# Patient Record
Sex: Female | Born: 2000 | Race: White | Hispanic: No | Marital: Single | Smoking: Never smoker
Health system: Southern US, Community
[De-identification: ages and names within clinical notes are randomized; demographics above are authoritative.]

## PROBLEM LIST (undated history)

## (undated) DIAGNOSIS — F913 Oppositional defiant disorder: Secondary | ICD-10-CM

## (undated) DIAGNOSIS — F909 Attention-deficit hyperactivity disorder, unspecified type: Secondary | ICD-10-CM

## (undated) DIAGNOSIS — F633 Trichotillomania: Secondary | ICD-10-CM

## (undated) DIAGNOSIS — F32A Depression, unspecified: Secondary | ICD-10-CM

## (undated) DIAGNOSIS — J45909 Unspecified asthma, uncomplicated: Secondary | ICD-10-CM

## (undated) DIAGNOSIS — F429 Obsessive-compulsive disorder, unspecified: Secondary | ICD-10-CM

## (undated) DIAGNOSIS — F419 Anxiety disorder, unspecified: Secondary | ICD-10-CM

## (undated) HISTORY — PX: INNER EAR SURGERY: SHX679

## (undated) HISTORY — PX: TONSILLECTOMY: SUR1361

---
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
## 2020-01-18 DIAGNOSIS — F419 Anxiety disorder, unspecified: Secondary | ICD-10-CM | POA: Diagnosis not present

## 2020-01-18 DIAGNOSIS — F902 Attention-deficit hyperactivity disorder, combined type: Secondary | ICD-10-CM | POA: Diagnosis not present

## 2020-01-18 DIAGNOSIS — F3481 Disruptive mood dysregulation disorder: Secondary | ICD-10-CM | POA: Diagnosis not present

## 2020-04-19 DIAGNOSIS — F419 Anxiety disorder, unspecified: Secondary | ICD-10-CM | POA: Diagnosis not present

## 2020-04-19 DIAGNOSIS — F3481 Disruptive mood dysregulation disorder: Secondary | ICD-10-CM | POA: Diagnosis not present

## 2020-04-19 DIAGNOSIS — F902 Attention-deficit hyperactivity disorder, combined type: Secondary | ICD-10-CM | POA: Diagnosis not present

## 2020-05-20 DIAGNOSIS — H9202 Otalgia, left ear: Secondary | ICD-10-CM | POA: Diagnosis not present

## 2020-08-07 DIAGNOSIS — N926 Irregular menstruation, unspecified: Secondary | ICD-10-CM | POA: Diagnosis not present

## 2020-08-07 DIAGNOSIS — F429 Obsessive-compulsive disorder, unspecified: Secondary | ICD-10-CM | POA: Diagnosis not present

## 2020-08-07 DIAGNOSIS — F418 Other specified anxiety disorders: Secondary | ICD-10-CM | POA: Diagnosis not present

## 2020-08-07 DIAGNOSIS — M549 Dorsalgia, unspecified: Secondary | ICD-10-CM | POA: Diagnosis not present

## 2020-08-14 DIAGNOSIS — F419 Anxiety disorder, unspecified: Secondary | ICD-10-CM | POA: Diagnosis not present

## 2020-08-14 DIAGNOSIS — F3481 Disruptive mood dysregulation disorder: Secondary | ICD-10-CM | POA: Diagnosis not present

## 2020-08-14 DIAGNOSIS — F902 Attention-deficit hyperactivity disorder, combined type: Secondary | ICD-10-CM | POA: Diagnosis not present

## 2020-08-21 DIAGNOSIS — Z0000 Encounter for general adult medical examination without abnormal findings: Secondary | ICD-10-CM | POA: Diagnosis not present

## 2020-08-21 DIAGNOSIS — Z13220 Encounter for screening for lipoid disorders: Secondary | ICD-10-CM | POA: Diagnosis not present

## 2020-09-09 DIAGNOSIS — N926 Irregular menstruation, unspecified: Secondary | ICD-10-CM | POA: Diagnosis not present

## 2020-09-09 DIAGNOSIS — N946 Dysmenorrhea, unspecified: Secondary | ICD-10-CM | POA: Diagnosis not present

## 2020-09-23 DIAGNOSIS — R110 Nausea: Secondary | ICD-10-CM | POA: Diagnosis not present

## 2020-09-23 DIAGNOSIS — R350 Frequency of micturition: Secondary | ICD-10-CM | POA: Diagnosis not present

## 2020-09-23 DIAGNOSIS — N898 Other specified noninflammatory disorders of vagina: Secondary | ICD-10-CM | POA: Diagnosis not present

## 2020-12-23 DIAGNOSIS — N926 Irregular menstruation, unspecified: Secondary | ICD-10-CM | POA: Diagnosis not present

## 2020-12-23 DIAGNOSIS — N946 Dysmenorrhea, unspecified: Secondary | ICD-10-CM | POA: Diagnosis not present

## 2020-12-23 DIAGNOSIS — F418 Other specified anxiety disorders: Secondary | ICD-10-CM | POA: Diagnosis not present

## 2021-03-10 ENCOUNTER — Encounter (HOSPITAL_BASED_OUTPATIENT_CLINIC_OR_DEPARTMENT_OTHER): Payer: Self-pay

## 2021-03-10 ENCOUNTER — Other Ambulatory Visit: Payer: Self-pay

## 2021-03-10 ENCOUNTER — Emergency Department (HOSPITAL_BASED_OUTPATIENT_CLINIC_OR_DEPARTMENT_OTHER)
Admission: EM | Admit: 2021-03-10 | Discharge: 2021-03-10 | Disposition: A | Discharge status: Home / Self Care | Payer: BC Managed Care – PPO | Attending: Emergency Medicine | Admitting: Emergency Medicine

## 2021-03-10 DIAGNOSIS — L509 Urticaria, unspecified: Secondary | ICD-10-CM | POA: Diagnosis not present

## 2021-03-10 DIAGNOSIS — T7840XA Allergy, unspecified, initial encounter: Secondary | ICD-10-CM | POA: Diagnosis not present

## 2021-03-10 DIAGNOSIS — T781XXA Other adverse food reactions, not elsewhere classified, initial encounter: Secondary | ICD-10-CM | POA: Diagnosis not present

## 2021-03-10 DIAGNOSIS — R0902 Hypoxemia: Secondary | ICD-10-CM | POA: Diagnosis not present

## 2021-03-10 DIAGNOSIS — T782XXA Anaphylactic shock, unspecified, initial encounter: Secondary | ICD-10-CM | POA: Diagnosis not present

## 2021-03-10 MED ORDER — PREDNISONE 10 MG PO TABS: 20.0000 mg | ORAL_TABLET | Freq: Two times a day (BID) | ORAL | 0 refills | Status: DC

## 2021-03-10 MED ORDER — EPINEPHRINE 0.3 MG/0.3ML IJ SOAJ: 0.3000 mg | INTRAMUSCULAR | 0 refills | Status: DC | PRN

## 2021-03-10 MED ORDER — FAMOTIDINE IN NACL 20-0.9 MG/50ML-% IV SOLN
20.0000 mg | Freq: Once | INTRAVENOUS | Status: AC
  Administered 2021-03-10: 20 mg via INTRAVENOUS
  Filled 2021-03-10: qty 50

## 2021-03-10 MED ORDER — METHYLPREDNISOLONE SODIUM SUCC 125 MG IJ SOLR
125.0000 mg | Freq: Once | INTRAMUSCULAR | Status: AC
  Administered 2021-03-10: 125 mg via INTRAVENOUS
  Filled 2021-03-10: qty 2

## 2021-03-10 NOTE — ED Notes (Signed)
Rounded on pt. Pt currently sleeping. Pt symptoms have improved 95% per mother. Pt denies difficulty with breathing or airway. No signs of distress // VSS. Will continue to monitor

## 2021-03-10 NOTE — Discharge Instructions (Signed)
Begin taking prednisone, 20 mg twice daily for the next 3 days.  Take Benadryl 25 mg every 6 hours for the next 3 days.  Use EpiPen as per instructions if you develop recurrent allergic reaction involving airway swelling, difficulty breathing.

## 2021-03-10 NOTE — ED Notes (Signed)
Rounded on pt. Pt currently denies airway being compromised at this time. Currently on monitor will continue to assess

## 2021-03-10 NOTE — ED Provider Notes (Signed)
[facility] EMERGENCY DEPT Provider Note   CSN: [id number] Arrival date & time: 03/10/21  0234     History  Chief Complaint  Patient presents with   Allergic Reaction    Gabriela Matthews is a 21 y.o. female.  Patient is a 21 year old female presenting with an allergic reaction.  Patient was eating cashews this evening when she began to have irritation to her throat, then her face began to swell.  EMS was called and patient was given EpiPen.  She also reports taking 50 mg of Benadryl prior to coming here.  The swelling seems to be improving somewhat.  She denies to me she is having any difficulty breathing or swallowing.  Patient denies history of nut allergy.  She denies having had cashews in the past.  The history is provided by the patient.  Allergic Reaction Presenting symptoms: swelling   Severity:  Moderate Context: nuts   Relieved by:  Antihistamines and epinephrine Worsened by:  Nothing     Home Medications Prior to Admission medications   Medication Sig Start Date End Date Taking? Authorizing Provider  amphetamine-dextroamphetamine (ADDERALL XR) 30 MG 24 hr capsule Take 20 mg by mouth daily.     [provider]  escitalopram (LEXAPRO) 20 MG tablet Take 20 mg by mouth every morning. 11/13/17   [provider]  ibuprofen (ADVIL,MOTRIN) 100 MG/5ML suspension Take 5 mg/kg by mouth every 6 (six) hours as needed. For pain    [provider]  Oxcarbazepine (TRILEPTAL) 300 MG tablet Take 450-1,050 mg by mouth 2 (two) times daily. 450 mg in the am and 1050 mg at bedtime 11/04/17   [provider]      Allergies    Patient has no known allergies.    Review of Systems   Review of Systems  All other systems reviewed and are negative.  Physical Exam Updated Vital Signs BP 129/84 (BP Location: Right Arm)    Pulse 76    Temp 98.3 F (36.8 C) (Oral)    Resp 18    Ht 5\' 5"  (1.651 m)    Wt 66.2 kg    LMP 03/04/2021 (Exact Date)     SpO2 100%    BMI 24.30 kg/m  Physical Exam Vitals and nursing note reviewed.  Constitutional:      General: She is not in acute distress.    Appearance: She is well-developed. She is not diaphoretic.  HENT:     Head: Normocephalic and atraumatic.     Comments: There is swelling to the soft tissues of the face and lips.  There is no obvious intraoral swelling.      Mouth/Throat:     Mouth: Mucous membranes are moist.     Pharynx: No oropharyngeal exudate or posterior oropharyngeal erythema.  Cardiovascular:     Rate and Rhythm: Normal rate and regular rhythm.     Heart sounds: No murmur heard.   No friction rub. No gallop.  Pulmonary:     Effort: Pulmonary effort is normal. No respiratory distress.     Breath sounds: Normal breath sounds. No stridor. No wheezing.  Abdominal:     General: Bowel sounds are normal. There is no distension.     Palpations: Abdomen is soft.     Tenderness: There is no abdominal tenderness.  Musculoskeletal:        General: Normal range of motion.     Cervical back: Normal range of motion and neck supple.  Skin:    General:  Skin is warm and dry.  Neurological:     General: No focal deficit present.     Mental Status: She is alert and oriented to person, place, and time.    ED Results / Procedures / Treatments   Labs (all labs ordered are listed, but only abnormal results are displayed) Labs Reviewed - No data to display  EKG None  Radiology No results found.  Procedures Procedures    Medications Ordered in ED Medications  methylPREDNISolone sodium succinate (SOLU-MEDROL) 125 mg/2 mL injection 125 mg (has no administration in time range)  famotidine (PEPCID) IVPB 20 mg premix (has no administration in time range)    ED Course/ Medical Decision Making/ A&P  Patient is a 21 year old female presenting with swelling of her face and throat after eating cashews.  She received EpiPen by EMS and took 50 mg of Benadryl at home prior to  arrival.  Patient arrives here with stable vital signs.  There is no stridor or significant airway swelling.  Patient was given Pepcid and Solu-Medrol.  She was observed for 4 hours and symptoms have gradually improved.  I feel as though patient can safely be discharged with prednisone, Benadryl, and EpiPen.  Final Clinical Impression(s) / ED Diagnoses Final diagnoses:  None    Rx / DC Orders ED Discharge Orders     None         Geoffery Lyons, MD 03/10/21 [id number]

## 2021-03-10 NOTE — ED Triage Notes (Signed)
Pt states she had cashews around 11pm last night.  (First time) Tingling of mouth and lips, lips are still swollen, hives have improved.   Benadryl & tums given by Mom @1115  Brought in by EMS Epi given in route

## 2021-03-25 DIAGNOSIS — N946 Dysmenorrhea, unspecified: Secondary | ICD-10-CM | POA: Diagnosis not present

## 2021-03-25 DIAGNOSIS — N926 Irregular menstruation, unspecified: Secondary | ICD-10-CM | POA: Diagnosis not present

## 2021-07-14 DIAGNOSIS — F411 Generalized anxiety disorder: Secondary | ICD-10-CM | POA: Diagnosis not present

## 2021-09-16 DIAGNOSIS — N946 Dysmenorrhea, unspecified: Secondary | ICD-10-CM | POA: Diagnosis not present

## 2021-09-16 DIAGNOSIS — N926 Irregular menstruation, unspecified: Secondary | ICD-10-CM | POA: Diagnosis not present

## 2022-01-29 DIAGNOSIS — F3189 Other bipolar disorder: Secondary | ICD-10-CM | POA: Diagnosis not present

## 2022-01-29 DIAGNOSIS — F411 Generalized anxiety disorder: Secondary | ICD-10-CM | POA: Diagnosis not present

## 2022-01-29 DIAGNOSIS — F603 Borderline personality disorder: Secondary | ICD-10-CM | POA: Diagnosis not present

## 2022-02-25 ENCOUNTER — Encounter (HOSPITAL_COMMUNITY): Payer: Self-pay

## 2022-02-25 ENCOUNTER — Ambulatory Visit (HOSPITAL_COMMUNITY)
Admission: EM | Admit: 2022-02-25 | Discharge: 2022-02-25 | Disposition: A | Discharge status: Home / Self Care | Payer: BC Managed Care – PPO | Attending: Physician Assistant | Admitting: Physician Assistant

## 2022-02-25 DIAGNOSIS — Z1152 Encounter for screening for COVID-19: Secondary | ICD-10-CM | POA: Diagnosis not present

## 2022-02-25 DIAGNOSIS — F1290 Cannabis use, unspecified, uncomplicated: Secondary | ICD-10-CM | POA: Insufficient documentation

## 2022-02-25 DIAGNOSIS — J069 Acute upper respiratory infection, unspecified: Secondary | ICD-10-CM | POA: Diagnosis not present

## 2022-02-25 DIAGNOSIS — R051 Acute cough: Secondary | ICD-10-CM | POA: Diagnosis not present

## 2022-02-25 HISTORY — DX: Anxiety disorder, unspecified: F41.9

## 2022-02-25 HISTORY — DX: Depression, unspecified: F32.A

## 2022-02-25 MED ORDER — PROMETHAZINE-DM 6.25-15 MG/5ML PO SYRP: 5.0000 mL | ORAL_SOLUTION | Freq: Two times a day (BID) | ORAL | 0 refills | Status: DC | PRN

## 2022-02-25 NOTE — ED Triage Notes (Signed)
Patient c/o sore throat, fatigue, nasal congestion, and a productive cough with small amount of clear/light yellow sputum x 4 days.  Patient states she has been taking Dayquil/Nyquil and the last dose was at 1000 and Advil 400 mg at 1200 today.

## 2022-02-25 NOTE — Discharge Instructions (Signed)
We will contact you if you are positive for COVID.  Make sure that you rest and drink plenty of fluid.  Use Promethazine DM for cough.  This will make you sleepy so do not drive or drink alcohol with taking it.  You can use over-the-counter medication including Mucinex, Flonase, Tylenol, ibuprofen.  If your symptoms are not improving by next week return for reevaluation.  If anything worsens and you have severe cough, shortness of breath, nausea/vomiting interfering with oral intake, weakness you need to be seen immediately.

## 2022-02-25 NOTE — ED Provider Notes (Signed)
[facility]    CSN: [id number] Arrival date & time: 02/25/22  1315      History   Chief Complaint Chief Complaint  Patient presents with   Cough    Sore throat, fatigue. Nasal congestion. Started about 4 days ago. No improvement with otc products. - Entered by patient   Sore Throat   Fatigue   Nasal Congestion    HPI Gabriela Matthews is a 22 y.o. female.   Patient presents today with a 4-day history of URI symptoms including fatigue, sore throat, congestion, cough.  She denies any fever, chest pain, shortness of breath, nausea, vomiting, diarrhea.  Denies any known sick contacts.  She has been taking DayQuil and NyQuil without improvement of symptoms.  She has not had COVID in the past.  She has a COVID-19 vaccinations including booster.  Denies any significant past medical history including smoking or allergies.  She does report a history of asthma when she was much younger but has not required albuterol inhaler in adulthood.  She is confident she is not pregnant.    Past Medical History:  Diagnosis Date   ADHD (attention deficit hyperactivity disorder)    Anxiety    Asthma    Depression    OCD (obsessive compulsive disorder)    ODD (oppositional defiant disorder)    Trichotillomania     There are no problems to display for this patient.   Past Surgical History:  Procedure Laterality Date   INNER EAR SURGERY     TONSILLECTOMY      OB History   No obstetric history on file.      Home Medications    Prior to Admission medications   Medication Sig Start Date End Date Taking? Authorizing Provider  promethazine-dextromethorphan (PROMETHAZINE-DM) 6.25-15 MG/5ML syrup Take 5 mLs by mouth 2 (two) times daily as needed for cough. 02/25/22  Yes Viktorya Arguijo K, PA-C  amphetamine-dextroamphetamine (ADDERALL XR) 30 MG 24 hr capsule Take 20 mg by mouth daily.     [provider]  EPINEPHrine 0.3 mg/0.3 mL IJ SOAJ injection Inject 0.3 mg into the muscle as  needed for anaphylaxis. 03/10/21   Veryl Speak, MD  escitalopram (LEXAPRO) 20 MG tablet Take 20 mg by mouth every morning. 11/13/17   [provider]  ibuprofen (ADVIL,MOTRIN) 100 MG/5ML suspension Take 5 mg/kg by mouth every 6 (six) hours as needed. For pain    [provider]  Oxcarbazepine (TRILEPTAL) 300 MG tablet Take 450-1,050 mg by mouth 2 (two) times daily. 450 mg in the am and 1050 mg at bedtime 11/04/17   [provider]    Family History Family History  Problem Relation Age of Onset   Thyroid disease Mother    Diabetes Mother    Hypertension Mother    Hypertension Father     Social History Social History   Tobacco Use   Smoking status: Never   Smokeless tobacco: Never  Vaping Use   Vaping Use: Never used  Substance Use Topics   Alcohol use: Yes   Drug use: Yes    Types: Marijuana     Allergies   Other   Review of Systems Review of Systems  Constitutional:  Positive for activity change and fatigue. Negative for appetite change and fever.  HENT:  Positive for congestion, sinus pressure and sore throat. Negative for sneezing.   Respiratory:  Positive for cough. Negative for shortness of breath.   Cardiovascular:  Negative for chest pain.  Gastrointestinal:  Negative for abdominal pain, diarrhea, nausea and vomiting.  Neurological:  Negative for dizziness, light-headedness and headaches.     Physical Exam Triage Vital Signs ED Triage Vitals  Enc Vitals Group     BP 02/25/22 1438 122/83     Pulse Rate 02/25/22 1438 (!) 1     Resp 02/25/22 1438 16     Temp 02/25/22 1438 98.5 F (36.9 C)     Temp Source 02/25/22 1438 Oral     SpO2 02/25/22 1438 97 %     Weight --      Height --      Head Circumference --      Peak Flow --      Pain Score 02/25/22 1441 4     Pain Loc --      Pain Edu? --      Excl. in [place]? --    No data found.  Updated Vital Signs BP 122/83 (BP Location: Right Arm)   Pulse 100   Temp 98.5 F (36.9 C)  (Oral)   Resp 16   LMP 01/25/2022   SpO2 97%   Visual Acuity Right Eye Distance:   Left Eye Distance:   Bilateral Distance:    Right Eye Near:   Left Eye Near:    Bilateral Near:     Physical Exam Vitals reviewed.  Constitutional:      General: She is awake. She is not in acute distress.    Appearance: Normal appearance. She is well-developed. She is not ill-appearing.     Comments: Very pleasant female appears stated age in no acute distress sitting comfortably in exam room  HENT:     Head: Normocephalic and atraumatic.     Right Ear: Tympanic membrane, ear canal and external ear normal. Tympanic membrane is not erythematous or bulging.     Left Ear: Tympanic membrane, ear canal and external ear normal. Tympanic membrane is not erythematous or bulging.     Nose:     Right Sinus: No maxillary sinus tenderness or frontal sinus tenderness.     Left Sinus: No maxillary sinus tenderness or frontal sinus tenderness.     Mouth/Throat:     Pharynx: Uvula midline. No oropharyngeal exudate or posterior oropharyngeal erythema.  Cardiovascular:     Rate and Rhythm: Normal rate and regular rhythm.     Heart sounds: Normal heart sounds, S1 normal and S2 normal. No murmur heard. Pulmonary:     Effort: Pulmonary effort is normal.     Breath sounds: Normal breath sounds. No wheezing, rhonchi or rales.     Comments: Clear to auscultation bilaterally Psychiatric:        Behavior: Behavior is cooperative.      UC Treatments / Results  Labs (all labs ordered are listed, but only abnormal results are displayed) Labs Reviewed  SARS CORONAVIRUS 2 (TAT 6-24 HRS)    EKG   Radiology No results found.  Procedures Procedures (including critical care time)  Medications Ordered in UC Medications - No data to display  Initial Impression / Assessment and Plan / UC Course  I have reviewed the triage vital signs and the nursing notes.  Pertinent labs & imaging results that were  available during my care of the patient were reviewed by me and considered in my medical decision making (see chart for details).     Patient is well-appearing and afebrile.  No evidence of acute infection on physical exam that would warrant initiation of antibiotics.  Flu  testing was deferred as she has been symptomatic for 4 days and outside the window effectiveness for Tamiflu.  COVID testing was obtained and is pending.  She is young and otherwise healthy and has been vaccinated so not a candidate for antiviral therapy.  Recommended conservative treatment measures including alternating Tylenol and ibuprofen, Mucinex, Flonase for symptom relief.  She was prescribed Promethazine DM for cough.  Discussed that this can be sedating and she is not to drive or drink alcohol while taking it.  Recommended that she rest and drink plenty of fluid.  Discussed that if her symptoms are not improving within a week she is to return for reevaluation.  Discussed that if anything worsen she should be seen immediately including fever, worsening cough, shortness of breath, nausea/vomiting interfere with oral intake, weakness.  Strict return precautions given.  Work excuse note with current CDC return to work guidelines based on COVID test result provided.  Final Clinical Impressions(s) / UC Diagnoses   Final diagnoses:  Upper respiratory tract infection, unspecified type  Acute cough     Discharge Instructions      We will contact you if you are positive for COVID.  Make sure that you rest and drink plenty of fluid.  Use Promethazine DM for cough.  This will make you sleepy so do not drive or drink alcohol with taking it.  You can use over-the-counter medication including Mucinex, Flonase, Tylenol, ibuprofen.  If your symptoms are not improving by next week return for reevaluation.  If anything worsens and you have severe cough, shortness of breath, nausea/vomiting interfering with oral intake, weakness you need to  be seen immediately.    ED Prescriptions     Medication Sig Dispense Auth. Provider   promethazine-dextromethorphan (PROMETHAZINE-DM) 6.25-15 MG/5ML syrup Take 5 mLs by mouth 2 (two) times daily as needed for cough. 118 mL Raekwon Winkowski K, PA-C      PDMP not reviewed this encounter.   Terrilee Croak, PA-C 02/25/22 1522

## 2022-02-26 DIAGNOSIS — F411 Generalized anxiety disorder: Secondary | ICD-10-CM | POA: Diagnosis not present

## 2022-02-26 DIAGNOSIS — F603 Borderline personality disorder: Secondary | ICD-10-CM | POA: Diagnosis not present

## 2022-02-26 DIAGNOSIS — F3189 Other bipolar disorder: Secondary | ICD-10-CM | POA: Diagnosis not present

## 2022-02-26 LAB — SARS CORONAVIRUS 2 (TAT 6-24 HRS): SARS Coronavirus 2: NEGATIVE

## 2022-03-25 DIAGNOSIS — F411 Generalized anxiety disorder: Secondary | ICD-10-CM | POA: Diagnosis not present

## 2022-03-25 DIAGNOSIS — F3189 Other bipolar disorder: Secondary | ICD-10-CM | POA: Diagnosis not present

## 2022-03-25 DIAGNOSIS — F603 Borderline personality disorder: Secondary | ICD-10-CM | POA: Diagnosis not present

## 2022-03-30 DIAGNOSIS — Z91018 Allergy to other foods: Secondary | ICD-10-CM | POA: Diagnosis not present

## 2022-03-30 DIAGNOSIS — J3089 Other allergic rhinitis: Secondary | ICD-10-CM | POA: Diagnosis not present

## 2022-04-08 DIAGNOSIS — N809 Endometriosis, unspecified: Secondary | ICD-10-CM | POA: Diagnosis not present

## 2022-04-08 DIAGNOSIS — N946 Dysmenorrhea, unspecified: Secondary | ICD-10-CM | POA: Diagnosis not present

## 2022-04-08 DIAGNOSIS — N921 Excessive and frequent menstruation with irregular cycle: Secondary | ICD-10-CM | POA: Diagnosis not present

## 2022-04-22 DIAGNOSIS — Z0000 Encounter for general adult medical examination without abnormal findings: Secondary | ICD-10-CM | POA: Diagnosis not present

## 2022-05-06 DIAGNOSIS — F3189 Other bipolar disorder: Secondary | ICD-10-CM | POA: Diagnosis not present

## 2022-05-06 DIAGNOSIS — F603 Borderline personality disorder: Secondary | ICD-10-CM | POA: Diagnosis not present

## 2022-05-06 DIAGNOSIS — F411 Generalized anxiety disorder: Secondary | ICD-10-CM | POA: Diagnosis not present

## 2022-06-17 ENCOUNTER — Other Ambulatory Visit: Payer: Self-pay | Admitting: Nurse Practitioner

## 2022-06-17 ENCOUNTER — Other Ambulatory Visit (HOSPITAL_COMMUNITY)
Admission: RE | Admit: 2022-06-17 | Discharge: 2022-06-17 | Disposition: A | Discharge status: Home / Self Care | Payer: BC Managed Care – PPO | Source: Ambulatory Visit | Attending: Nurse Practitioner | Admitting: Nurse Practitioner

## 2022-06-17 DIAGNOSIS — Z124 Encounter for screening for malignant neoplasm of cervix: Secondary | ICD-10-CM | POA: Diagnosis not present

## 2022-06-17 DIAGNOSIS — N946 Dysmenorrhea, unspecified: Secondary | ICD-10-CM | POA: Diagnosis not present

## 2022-06-17 DIAGNOSIS — N926 Irregular menstruation, unspecified: Secondary | ICD-10-CM | POA: Diagnosis not present

## 2022-06-23 LAB — CYTOLOGY - PAP: Diagnosis: NEGATIVE

## 2022-07-07 DIAGNOSIS — F3189 Other bipolar disorder: Secondary | ICD-10-CM | POA: Diagnosis not present

## 2022-07-07 DIAGNOSIS — F411 Generalized anxiety disorder: Secondary | ICD-10-CM | POA: Diagnosis not present

## 2022-07-07 DIAGNOSIS — F603 Borderline personality disorder: Secondary | ICD-10-CM | POA: Diagnosis not present

## 2022-07-28 DIAGNOSIS — N946 Dysmenorrhea, unspecified: Secondary | ICD-10-CM | POA: Diagnosis not present

## 2022-07-28 DIAGNOSIS — R42 Dizziness and giddiness: Secondary | ICD-10-CM | POA: Diagnosis not present

## 2022-07-28 DIAGNOSIS — N926 Irregular menstruation, unspecified: Secondary | ICD-10-CM | POA: Diagnosis not present

## 2022-07-28 DIAGNOSIS — D500 Iron deficiency anemia secondary to blood loss (chronic): Secondary | ICD-10-CM | POA: Diagnosis not present

## 2022-09-15 ENCOUNTER — Other Ambulatory Visit: Payer: Self-pay

## 2022-09-15 ENCOUNTER — Emergency Department (HOSPITAL_BASED_OUTPATIENT_CLINIC_OR_DEPARTMENT_OTHER)
Admission: EM | Admit: 2022-09-15 | Discharge: 2022-09-15 | Disposition: A | Discharge status: Home / Self Care | Payer: BC Managed Care – PPO | Attending: Emergency Medicine | Admitting: Emergency Medicine

## 2022-09-15 ENCOUNTER — Encounter (HOSPITAL_BASED_OUTPATIENT_CLINIC_OR_DEPARTMENT_OTHER): Payer: Self-pay

## 2022-09-15 DIAGNOSIS — T7840XA Allergy, unspecified, initial encounter: Secondary | ICD-10-CM | POA: Insufficient documentation

## 2022-09-15 MED ORDER — PREDNISONE 10 MG PO TABS: 20.0000 mg | ORAL_TABLET | Freq: Every day | ORAL | 0 refills | Status: DC

## 2022-09-15 MED ORDER — FAMOTIDINE IN NACL 20-0.9 MG/50ML-% IV SOLN
20.0000 mg | Freq: Once | INTRAVENOUS | Status: AC
  Administered 2022-09-15: 20 mg via INTRAVENOUS
  Filled 2022-09-15: qty 50

## 2022-09-15 MED ORDER — SODIUM CHLORIDE 0.9 % IV BOLUS
1000.0000 mL | Freq: Once | INTRAVENOUS | Status: AC
  Administered 2022-09-15: 1000 mL via INTRAVENOUS

## 2022-09-15 MED ORDER — EPINEPHRINE 0.3 MG/0.3ML IJ SOAJ: 0.3000 mg | INTRAMUSCULAR | 0 refills | Status: AC | PRN

## 2022-09-15 MED ORDER — METHYLPREDNISOLONE SODIUM SUCC 125 MG IJ SOLR
125.0000 mg | Freq: Once | INTRAMUSCULAR | Status: AC
  Administered 2022-09-15: 125 mg via INTRAVENOUS
  Filled 2022-09-15: qty 2

## 2022-09-15 NOTE — ED Provider Notes (Signed)
[place] EMERGENCY DEPARTMENT AT [facility] Provider Note   CSN: [id number] Arrival date & time: 09/15/22  1334     History  Chief Complaint  Patient presents with   Allergic Reaction    Gabriela Matthews is a 22 y.o. 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  Patient has she ate a cookie with cashews in it and immediately started feeling tightness in her chest.  Patient notes that she took 50 mg of Benadryl at 12:55 PM and then used her EpiPen at 1:20 PM and then came to the ER.  Patient notes this feels similar to previous episodes of allergic reactions but notes that now that she has had medication in her the tightness in her chest has subsided.  Patient denies any difficulty breathing at this time and states that she is able to swallow saliva without issue.  Patient denies any skin color changes, rashes, facial or neck swelling, chest pain, shortness of breath, wheezing.   Home Medications Prior to Admission medications   Medication Sig Start Date End Date Taking? Authorizing Provider  predniSONE (DELTASONE) 10 MG tablet Take 2 tablets (20 mg total) by mouth daily. 09/15/22  Yes Netta Corrigan, PA-C  amphetamine-dextroamphetamine (ADDERALL XR) 30 MG 24 hr capsule Take 20 mg by mouth daily.     [provider]  EPINEPHrine 0.3 mg/0.3 mL IJ SOAJ injection Inject 0.3 mg into the muscle as needed for anaphylaxis. 09/15/22   Netta Corrigan, PA-C  escitalopram (LEXAPRO) 20 MG tablet Take 20 mg by mouth every morning. 11/13/17   [provider]  ibuprofen (ADVIL,MOTRIN) 100 MG/5ML suspension Take 5 mg/kg by mouth every 6 (six) hours as needed. For pain    [provider]  Oxcarbazepine (TRILEPTAL) 300 MG tablet Take 450-1,050 mg by mouth 2 (two) times daily. 450 mg in the am and 1050 mg at bedtime 11/04/17   [provider]  promethazine-dextromethorphan (PROMETHAZINE-DM) 6.25-15  MG/5ML syrup Take 5 mLs by mouth 2 (two) times daily as needed for cough. 02/25/22   Raspet, Noberto Retort, PA-C      Allergies    Other    Review of Systems   Review of Systems  Physical Exam Updated Vital Signs BP 123/87   Pulse (!) 103   Temp 99 F (37.2 C)   Resp 18   Ht 5\' 4"  (1.626 m)   Wt 73.5 kg   LMP 09/04/2022   SpO2 98%   BMI 27.81 kg/m  Physical Exam Constitutional:      General: She is not in acute distress. HENT:     Head:     Comments: No facial swelling noted    Mouth/Throat:     Mouth: Mucous membranes are moist.     Pharynx: No posterior oropharyngeal erythema.     Comments: No edema noted No muffled voice Able to tolerate secretions Neck:     Comments: No neck swelling noted Cardiovascular:     Rate and Rhythm: Normal rate and regular rhythm.     Pulses: Normal pulses.     Heart sounds: Normal heart sounds.  Pulmonary:     Effort: Pulmonary effort is normal. No respiratory distress.     Breath sounds: Normal breath sounds.  Musculoskeletal:        General: Normal range of motion.     Cervical back: Normal range of motion. No rigidity or tenderness.  Skin:  General: Skin is warm and dry.     Findings: No rash.  Neurological:     Mental Status: She is alert.     ED Results / Procedures / Treatments   Labs (all labs ordered are listed, but only abnormal results are displayed) Labs Reviewed - No data to display  EKG None  Radiology No results found.  Procedures Procedures    Medications Ordered in ED Medications  sodium chloride 0.9 % bolus 1,000 mL (0 mLs Intravenous Stopped 09/15/22 1605)  methylPREDNISolone sodium succinate (SOLU-MEDROL) 125 mg/2 mL injection 125 mg (125 mg Intravenous Given 09/15/22 1456)  famotidine (PEPCID) IVPB 20 mg premix (0 mg Intravenous Stopped 09/15/22 1546)    ED Course/ Medical Decision Making/ A&P                                 Medical Decision Making Risk Prescription drug management.   [name] 21 y.o. presented today for allergic reaction. Working DDx that I considered at this time includes, but not limited to, anaphylaxis, anaphylactic shock, airway compromise, simple allergic reaction, SJS/TEN.  R/o DDx: anaphylaxis, anaphylactic shock, airway compromise, SJS/TEN: These are considered less likely due to history of present illness, physical exam, labs/imaging findings  Review of prior external notes: 12/26/2022 ED  Unique Tests and My Interpretation: None  Discussion with Independent Historian:  Mom  Discussion of Management of Tests: None  Risk: Medium: prescription drug management  Risk Stratification Score: None  Plan: On exam patient was in no acute distress with stable vitals.  Patient was walking around the room when I first entered and had reassuring exam in which she was able to speak in full sentences and had clear lungs bilaterally.  The rest of patient's physical exam was also reassuring.  Due to patient already having the Benadryl and epi on board Solu-Medrol and fluids were ordered along with Pepcid.  Due to patient using the EpiPen at 1:20 PM patient will be monitored until 5:20 PM.  Patient endorsing any chest pain or shortness of breath and had clear lungs and so imaging not obtained and labs not obtained at this time.  Patient stable at this time.  At 5:25 PM patient was reassessed and stated that she felt much better after the medications and wants to be discharged.  Patient will be discharged on prednisone and have her EpiPen was refilled with primary care follow-up.  Patient given work note at her request.  Patient was given return precautions. Patient stable for discharge at this time.  Patient verbalized understanding of plan.         Final Clinical Impression(s) / ED Diagnoses Final diagnoses:  Allergic reaction, initial encounter    Rx / DC Orders ED Discharge Orders          Ordered    predniSONE (DELTASONE) 10 MG tablet  Daily         09/15/22 1729    EPINEPHrine 0.3 mg/0.3 mL IJ SOAJ injection  As needed        09/15/22 1729              Netta Corrigan, PA-C 09/15/22 1733    Ernie Avena, MD 09/15/22 [phone]

## 2022-09-15 NOTE — Discharge Instructions (Addendum)
Please follow-up with your primary care provider regarding recent symptoms and ER visit.  Today you are treated with steroids and fluids along with an antiacid and your symptoms improved.  I have prescribed for you steroids to take over the next few days and refilled your epinephrine pen.  Please return to ER if symptoms change or worsen.

## 2022-09-15 NOTE — ED Notes (Signed)
Discharge instructions, follow up care, and prescriptions reviewed and explained, pt verbalized understanding and had no further questions on d/c. Pt caox4, ambulatory, NAD on d/c.  

## 2022-09-15 NOTE — ED Triage Notes (Signed)
Pt arrives with reports of allergic reaction to nuts in a cookie today ate this around 12:40 states that she only took one bite before recognizing it had nuts in it. Reports feeling dry itchy throat with upset stomach. Took benadryl at 12:50 (50 mg), Then took Epi pen at 1320. Pt speaking in full sentences able to maintain saliva no oral swelling at this time.

## 2022-09-28 DIAGNOSIS — D500 Iron deficiency anemia secondary to blood loss (chronic): Secondary | ICD-10-CM | POA: Diagnosis not present

## 2022-09-28 DIAGNOSIS — N926 Irregular menstruation, unspecified: Secondary | ICD-10-CM | POA: Diagnosis not present

## 2022-09-28 DIAGNOSIS — N946 Dysmenorrhea, unspecified: Secondary | ICD-10-CM | POA: Diagnosis not present

## 2022-09-30 DIAGNOSIS — F3189 Other bipolar disorder: Secondary | ICD-10-CM | POA: Diagnosis not present

## 2022-09-30 DIAGNOSIS — F411 Generalized anxiety disorder: Secondary | ICD-10-CM | POA: Diagnosis not present

## 2022-09-30 DIAGNOSIS — F603 Borderline personality disorder: Secondary | ICD-10-CM | POA: Diagnosis not present

## 2022-10-01 ENCOUNTER — Telehealth: Payer: Self-pay | Admitting: *Deleted

## 2022-10-01 NOTE — Telephone Encounter (Signed)
Transition Care Management Unsuccessful Follow-up Telephone Call  Date of discharge and from where:  [facility]  09/15/2022  Attempts:  1st Attempt  Reason for unsuccessful TCM follow-up call:  Left voice message

## 2022-10-02 DIAGNOSIS — Z5181 Encounter for therapeutic drug level monitoring: Secondary | ICD-10-CM | POA: Diagnosis not present

## 2022-10-15 DIAGNOSIS — N926 Irregular menstruation, unspecified: Secondary | ICD-10-CM | POA: Diagnosis not present

## 2022-10-15 DIAGNOSIS — N946 Dysmenorrhea, unspecified: Secondary | ICD-10-CM | POA: Diagnosis not present

## 2022-10-22 DIAGNOSIS — F411 Generalized anxiety disorder: Secondary | ICD-10-CM | POA: Diagnosis not present

## 2022-11-06 ENCOUNTER — Encounter (HOSPITAL_BASED_OUTPATIENT_CLINIC_OR_DEPARTMENT_OTHER): Payer: Self-pay

## 2022-11-06 ENCOUNTER — Emergency Department (HOSPITAL_BASED_OUTPATIENT_CLINIC_OR_DEPARTMENT_OTHER)
Admission: EM | Admit: 2022-11-06 | Discharge: 2022-11-06 | Disposition: A | Discharge status: Home / Self Care | Payer: BC Managed Care – PPO | Attending: Emergency Medicine | Admitting: Emergency Medicine

## 2022-11-06 ENCOUNTER — Other Ambulatory Visit: Payer: Self-pay

## 2022-11-06 ENCOUNTER — Emergency Department (HOSPITAL_BASED_OUTPATIENT_CLINIC_OR_DEPARTMENT_OTHER): Payer: BC Managed Care – PPO

## 2022-11-06 DIAGNOSIS — N83201 Unspecified ovarian cyst, right side: Secondary | ICD-10-CM | POA: Insufficient documentation

## 2022-11-06 DIAGNOSIS — J45909 Unspecified asthma, uncomplicated: Secondary | ICD-10-CM | POA: Insufficient documentation

## 2022-11-06 DIAGNOSIS — R1030 Lower abdominal pain, unspecified: Secondary | ICD-10-CM

## 2022-11-06 DIAGNOSIS — R1012 Left upper quadrant pain: Secondary | ICD-10-CM | POA: Diagnosis not present

## 2022-11-06 DIAGNOSIS — R102 Pelvic and perineal pain: Secondary | ICD-10-CM | POA: Diagnosis not present

## 2022-11-06 LAB — COMPREHENSIVE METABOLIC PANEL
ALT: 11 U/L (ref 0–44)
AST: 13 U/L — ABNORMAL LOW (ref 15–41)
Albumin: 4.8 g/dL (ref 3.5–5.0)
Alkaline Phosphatase: 44 U/L (ref 38–126)
Anion gap: 7 (ref 5–15)
BUN: 11 mg/dL (ref 6–20)
CO2: 26 mmol/L (ref 22–32)
Calcium: 9.9 mg/dL (ref 8.9–10.3)
Chloride: 105 mmol/L (ref 98–111)
Creatinine, Ser: 0.6 mg/dL (ref 0.44–1.00)
GFR, Estimated: >60 mL/min (ref >60)
Glucose, Bld: 97 mg/dL (ref 70–99)
Potassium: 4.2 mmol/L (ref 3.5–5.1)
Sodium: 138 mmol/L (ref 135–145)
Total Bilirubin: 0.3 mg/dL (ref 0.3–1.2)
Total Protein: 7.5 g/dL (ref 6.5–8.1)

## 2022-11-06 LAB — CBC WITH DIFFERENTIAL/PLATELET
Abs Immature Granulocytes: 0.02 10*3/uL (ref 0.00–0.07)
Basophils Absolute: 0 10*3/uL (ref 0.0–0.1)
Basophils Relative: 0 %
Eosinophils Absolute: 0.1 10*3/uL (ref 0.0–0.5)
Eosinophils Relative: 2 %
HCT: 39.8 % (ref 36.0–46.0)
Hemoglobin: 13.2 g/dL (ref 12.0–15.0)
Immature Granulocytes: 0 %
Lymphocytes Relative: 26 %
Lymphs Abs: 2.3 10*3/uL (ref 0.7–4.0)
MCH: 29.1 pg (ref 26.0–34.0)
MCHC: 33.2 g/dL (ref 30.0–36.0)
MCV: 87.9 fL (ref 80.0–100.0)
Monocytes Absolute: 0.6 10*3/uL (ref 0.1–1.0)
Monocytes Relative: 7 %
Neutro Abs: 5.7 10*3/uL (ref 1.7–7.7)
Neutrophils Relative %: 65 %
Platelets: 424 10*3/uL — ABNORMAL HIGH (ref 150–400)
RBC: 4.53 MIL/uL (ref 3.87–5.11)
RDW: 12.8 % (ref 11.5–15.5)
WBC: 8.9 10*3/uL (ref 4.0–10.5)
nRBC: 0 % (ref 0.0–0.2)

## 2022-11-06 LAB — URINALYSIS, W/ REFLEX TO CULTURE (INFECTION SUSPECTED)
Bilirubin Urine: NEGATIVE
Glucose, UA: NEGATIVE mg/dL
Hgb urine dipstick: NEGATIVE
Ketones, ur: NEGATIVE mg/dL
Nitrite: NEGATIVE
Protein, ur: NEGATIVE mg/dL
Specific Gravity, Urine: 1.017 (ref 1.005–1.030)
pH: 6.5 (ref 5.0–8.0)

## 2022-11-06 LAB — PREGNANCY, URINE: Preg Test, Ur: NEGATIVE

## 2022-11-06 MED ORDER — KETOROLAC TROMETHAMINE 30 MG/ML IJ SOLN
30.0000 mg | Freq: Once | INTRAMUSCULAR | Status: AC
  Administered 2022-11-06: 30 mg via INTRAVENOUS
  Filled 2022-11-06: qty 1

## 2022-11-06 MED ORDER — NAPROXEN 375 MG PO TABS: 375.0000 mg | ORAL_TABLET | Freq: Three times a day (TID) | ORAL | 0 refills | Status: DC

## 2022-11-06 NOTE — ED Provider Notes (Signed)
[place] EMERGENCY DEPARTMENT AT [facility] Provider Note   CSN: [id number] Arrival date & time: 11/06/22  1035     History {Add pertinent medical, surgical, social history, OB history to HPI:1} Chief Complaint  Patient presents with   Abdominal Pain    Gabriela Matthews is a 22 y.o. female.  HPI     A few weeks of cramping in Pelvic pain for a few weeks, stomach pain up higher went away for now but started this morning--left upper quadrant.  Stabbing pain is a 4/10 now in LLQ, stomach pain was 7/10 cramping.  Pain waxing and waning over the last several weeks in pelvis. Stomach pain is now gone but the pelvic pain is still there. Nausea, no vomiting No diarrhea or constipation No vaginal bleeding or discharge No fever No urinary symptoms. Has had pain like this before,  was cramps. Has cysts 3cm followed by obgyn. If she had pain and nausea. Last few days worse. Took 400mg  ibuprofen   Home Medications Prior to Admission medications   Medication Sig Start Date End Date Taking? Authorizing Provider  amphetamine-dextroamphetamine (ADDERALL XR) 30 MG 24 hr capsule Take 20 mg by mouth daily.     [provider]  EPINEPHrine 0.3 mg/0.3 mL IJ SOAJ injection Inject 0.3 mg into the muscle as needed for anaphylaxis. 09/15/22   Netta Corrigan, PA-C  escitalopram (LEXAPRO) 20 MG tablet Take 20 mg by mouth every morning. 11/13/17   [provider]  ibuprofen (ADVIL,MOTRIN) 100 MG/5ML suspension Take 5 mg/kg by mouth every 6 (six) hours as needed. For pain    [provider]  Oxcarbazepine (TRILEPTAL) 300 MG tablet Take 450-1,050 mg by mouth 2 (two) times daily. 450 mg in the am and 1050 mg at bedtime 11/04/17   [provider]  predniSONE (DELTASONE) 10 MG tablet Take 2 tablets (20 mg total) by mouth daily. 09/15/22   Netta Corrigan, PA-C  promethazine-dextromethorphan (PROMETHAZINE-DM) 6.25-15 MG/5ML syrup Take 5 mLs by mouth 2 (two) times daily  as needed for cough. 02/25/22   Raspet, Noberto Retort, PA-C      Allergies    Gabapentin and Other    Review of Systems   Review of Systems  Physical Exam Updated Vital Signs BP 134/89   Pulse 87   Temp 98 F (36.7 C)   Resp 16   LMP 10/13/2022   SpO2 97%  Physical Exam  ED Results / Procedures / Treatments   Labs (all labs ordered are listed, but only abnormal results are displayed) Labs Reviewed  PREGNANCY, URINE  URINALYSIS, W/ REFLEX TO CULTURE (INFECTION SUSPECTED)    EKG None  Radiology No results found.  Procedures Procedures  {Document cardiac monitor, telemetry assessment procedure when appropriate:1}  Medications Ordered in ED Medications - No data to display  ED Course/ Medical Decision Making/ A&P   {   Click here for ABCD2, HEART and other calculatorsREFRESH Note before signing :1}                              Medical Decision Making Amount and/or Complexity of Data Reviewed Labs: ordered.   ***  {Document critical care time when appropriate:1} {Document review of labs and clinical decision tools ie heart score, Chads2Vasc2 etc:1}  {Document your independent review of radiology images, and any outside records:1} {Document your discussion with family members, caretakers, and with consultants:1} {Document social determinants of health affecting pt's  care:1} {Document your decision making why or why not admission, treatments were needed:1} Final Clinical Impression(s) / ED Diagnoses Final diagnoses:  None    Rx / DC Orders ED Discharge Orders     None

## 2022-11-06 NOTE — ED Notes (Signed)
Patient verbalizes understanding of discharge instructions. Opportunity for questioning and answers were provided. Patient discharged from ED. IV removed x1

## 2022-11-06 NOTE — ED Triage Notes (Addendum)
Pt c/o abd pain/ pelvic pain, abd pain has "faded since this morning," "stabbing" pelvic pain "for the last couple days- recurrent but not constant." Endorses "a little" nausea, no vomiting. LMP 9/30  Hx ovarian cysts

## 2022-11-19 DIAGNOSIS — F331 Major depressive disorder, recurrent, moderate: Secondary | ICD-10-CM | POA: Diagnosis not present

## 2022-11-30 DIAGNOSIS — Z0000 Encounter for general adult medical examination without abnormal findings: Secondary | ICD-10-CM | POA: Diagnosis not present

## 2022-11-30 DIAGNOSIS — Z13220 Encounter for screening for lipoid disorders: Secondary | ICD-10-CM | POA: Diagnosis not present

## 2022-11-30 DIAGNOSIS — R6889 Other general symptoms and signs: Secondary | ICD-10-CM | POA: Diagnosis not present

## 2022-11-30 DIAGNOSIS — E638 Other specified nutritional deficiencies: Secondary | ICD-10-CM | POA: Diagnosis not present

## 2022-11-30 DIAGNOSIS — M542 Cervicalgia: Secondary | ICD-10-CM | POA: Diagnosis not present

## 2022-11-30 DIAGNOSIS — G8929 Other chronic pain: Secondary | ICD-10-CM | POA: Diagnosis not present

## 2022-12-14 DIAGNOSIS — Z23 Encounter for immunization: Secondary | ICD-10-CM | POA: Diagnosis not present

## 2023-02-10 ENCOUNTER — Other Ambulatory Visit: Payer: Self-pay

## 2023-02-10 ENCOUNTER — Encounter (HOSPITAL_BASED_OUTPATIENT_CLINIC_OR_DEPARTMENT_OTHER): Payer: Self-pay | Admitting: Emergency Medicine

## 2023-02-10 DIAGNOSIS — R102 Pelvic and perineal pain: Secondary | ICD-10-CM | POA: Diagnosis present

## 2023-02-10 DIAGNOSIS — N8301 Follicular cyst of right ovary: Secondary | ICD-10-CM | POA: Insufficient documentation

## 2023-02-10 NOTE — ED Triage Notes (Signed)
Pt to ED from home c/o nausea, lower back pain, and right pelvic pain since this morning.  States does have 5cm ovarian cyst and hx of endometriosis and pain feels similar. Denies urinary changes.

## 2023-02-11 ENCOUNTER — Emergency Department (HOSPITAL_BASED_OUTPATIENT_CLINIC_OR_DEPARTMENT_OTHER): Payer: Medicaid Other

## 2023-02-11 ENCOUNTER — Emergency Department (HOSPITAL_BASED_OUTPATIENT_CLINIC_OR_DEPARTMENT_OTHER)
Admission: EM | Admit: 2023-02-11 | Discharge: 2023-02-11 | Disposition: A | Discharge status: Home / Self Care | Payer: Medicaid Other | Attending: Emergency Medicine | Admitting: Emergency Medicine

## 2023-02-11 DIAGNOSIS — N83201 Unspecified ovarian cyst, right side: Secondary | ICD-10-CM

## 2023-02-11 LAB — URINALYSIS, ROUTINE W REFLEX MICROSCOPIC
Bacteria, UA: NONE SEEN
Bilirubin Urine: NEGATIVE
Glucose, UA: NEGATIVE mg/dL
Hgb urine dipstick: NEGATIVE
Ketones, ur: NEGATIVE mg/dL
Leukocytes,Ua: NEGATIVE
Nitrite: NEGATIVE
Protein, ur: NEGATIVE mg/dL
Specific Gravity, Urine: 1.015 (ref 1.005–1.030)
pH: 6.5 (ref 5.0–8.0)

## 2023-02-11 LAB — PREGNANCY, URINE: Preg Test, Ur: NEGATIVE

## 2023-02-11 MED ORDER — TRAMADOL HCL 50 MG PO TABS: 50.0000 mg | ORAL_TABLET | Freq: Four times a day (QID) | ORAL | 0 refills | Status: DC | PRN

## 2023-02-11 MED ORDER — ONDANSETRON 4 MG PO TBDP
8.0000 mg | ORAL_TABLET | Freq: Once | ORAL | Status: AC
  Administered 2023-02-11: 8 mg via ORAL
  Filled 2023-02-11: qty 2

## 2023-02-11 MED ORDER — HYDROCODONE-ACETAMINOPHEN 5-325 MG PO TABS
2.0000 | ORAL_TABLET | Freq: Once | ORAL | Status: AC
  Administered 2023-02-11: 2 via ORAL
  Filled 2023-02-11: qty 2

## 2023-02-11 NOTE — ED Notes (Signed)
Pt transported to CT via wheelchair.

## 2023-02-11 NOTE — ED Notes (Signed)
Pt ambulates independently to bathroom with steady gait.

## 2023-02-11 NOTE — ED Provider Notes (Signed)
[place] EMERGENCY DEPARTMENT AT [facility] Provider Note   CSN: [id number] Arrival date & time: 02/10/23  2000     History  Chief Complaint  Patient presents with   Nausea   Pelvic Pain    Gabriela Matthews is a 23 y.o. female.  Patient is a 23 year old female presenting with complaints of right lower quadrant abdominal pain.  She has a history of ovarian cysts and this feels similar.  She reports a 6 cm cyst that was diagnosed in September and she is in the process of scheduling surgery for this and for endometriosis.  Pain became worse this evening and presents for evaluation of this.  No fevers or chills.  No vaginal bleeding.  No bowel or bladder complaints.  She has had several ultrasounds in the past.  The history is provided by the patient.       Home Medications Prior to Admission medications   Medication Sig Start Date End Date Taking? Authorizing Provider  amphetamine-dextroamphetamine (ADDERALL XR) 30 MG 24 hr capsule Take 20 mg by mouth daily.     [provider]  EPINEPHrine 0.3 mg/0.3 mL IJ SOAJ injection Inject 0.3 mg into the muscle as needed for anaphylaxis. 09/15/22   Netta Corrigan, PA-C  escitalopram (LEXAPRO) 20 MG tablet Take 20 mg by mouth every morning. 11/13/17   [provider]  ibuprofen (ADVIL,MOTRIN) 100 MG/5ML suspension Take 5 mg/kg by mouth every 6 (six) hours as needed. For pain    [provider]  naproxen (NAPROSYN) 375 MG tablet Take 1 tablet (375 mg total) by mouth 3 (three) times daily with meals. 11/06/22   Alvira Monday, MD  Oxcarbazepine (TRILEPTAL) 300 MG tablet Take 450-1,050 mg by mouth 2 (two) times daily. 450 mg in the am and 1050 mg at bedtime 11/04/17   [provider]  predniSONE (DELTASONE) 10 MG tablet Take 2 tablets (20 mg total) by mouth daily. 09/15/22   Netta Corrigan, PA-C  promethazine-dextromethorphan (PROMETHAZINE-DM) 6.25-15 MG/5ML syrup Take 5 mLs by mouth 2 (two) times daily  as needed for cough. 02/25/22   Raspet, Noberto Retort, PA-C      Allergies    Gabapentin and Other    Review of Systems   Review of Systems  All other systems reviewed and are negative.   Physical Exam Updated Vital Signs BP (!) 145/99   Pulse 84   Temp 98.7 F (37.1 C) (Oral)   Resp 16   Ht 5\' 4"  (1.626 m)   Wt 73 kg   SpO2 100%   BMI 27.62 kg/m  Physical Exam Vitals and nursing note reviewed.  Constitutional:      General: She is not in acute distress.    Appearance: She is well-developed. She is not diaphoretic.  HENT:     Head: Normocephalic and atraumatic.  Cardiovascular:     Rate and Rhythm: Normal rate and regular rhythm.     Heart sounds: No murmur heard.    No friction rub. No gallop.  Pulmonary:     Effort: Pulmonary effort is normal. No respiratory distress.     Breath sounds: Normal breath sounds. No wheezing.  Abdominal:     General: Bowel sounds are normal. There is no distension.     Palpations: Abdomen is soft.     Tenderness: There is abdominal tenderness. There is no guarding or rebound.     Comments: There is mild, generalized abdominal pain, but most notable in the right lower quadrant.  Musculoskeletal:        General: Normal range of motion.     Cervical back: Normal range of motion and neck supple.  Skin:    General: Skin is warm and dry.  Neurological:     General: No focal deficit present.     Mental Status: She is alert and oriented to person, place, and time.     ED Results / Procedures / Treatments   Labs (all labs ordered are listed, but only abnormal results are displayed) Labs Reviewed  PREGNANCY, URINE  URINALYSIS, ROUTINE W REFLEX MICROSCOPIC    EKG None  Radiology No results found.  Procedures Procedures    Medications Ordered in ED Medications  ondansetron (ZOFRAN-ODT) disintegrating tablet 8 mg (has no administration in time range)  HYDROcodone-acetaminophen (NORCO/VICODIN) 5-325 MG per tablet 2 tablet (has no  administration in time range)    ED Course/ Medical Decision Making/ A&P  Patient is a 23 year old female presenting with right lower quadrant pain as described in the HPI.  She has a history of ovarian cyst and this feels similar.  Patient arrives with stable vital signs and is afebrile.  Physical examination reveals tenderness to the right lower quadrant, but no peritoneal signs.  Laboratory studies obtained including urinalysis and pregnancy test, both of which are unremarkable.  CT scan with renal protocol obtained showing no evidence for renal calculus, but does show a 5.6 cm right ovarian cyst.  I suspect this to be the cause of her pain.  I will advise her to take ibuprofen and prescribed tramadol which she can take if this is not helping.  Patient is to follow-up with her GYN in the next few days.  I have considered ovarian cyst, however her presentation is not consistent with this and have very low suspicion.  Final Clinical Impression(s) / ED Diagnoses Final diagnoses:  None    Rx / DC Orders ED Discharge Orders     None         Geoffery Lyons, MD 02/11/23 [phone]

## 2023-02-11 NOTE — Discharge Instructions (Signed)
Begin taking ibuprofen 600 mg every 6 hours as needed for pain.  Begin taking tramadol as prescribed as needed for pain not relieved with ibuprofen.  Follow-up with your GYN in the next week.

## 2023-03-19 ENCOUNTER — Encounter (HOSPITAL_BASED_OUTPATIENT_CLINIC_OR_DEPARTMENT_OTHER): Payer: Self-pay | Admitting: Emergency Medicine

## 2023-03-19 ENCOUNTER — Other Ambulatory Visit: Payer: Self-pay

## 2023-03-19 ENCOUNTER — Emergency Department (HOSPITAL_BASED_OUTPATIENT_CLINIC_OR_DEPARTMENT_OTHER)
Admission: EM | Admit: 2023-03-19 | Discharge: 2023-03-19 | Disposition: A | Discharge status: Home / Self Care | Attending: Emergency Medicine | Admitting: Emergency Medicine

## 2023-03-19 ENCOUNTER — Emergency Department (HOSPITAL_BASED_OUTPATIENT_CLINIC_OR_DEPARTMENT_OTHER)

## 2023-03-19 DIAGNOSIS — D75839 Thrombocytosis, unspecified: Secondary | ICD-10-CM | POA: Insufficient documentation

## 2023-03-19 DIAGNOSIS — Z79899 Other long term (current) drug therapy: Secondary | ICD-10-CM | POA: Insufficient documentation

## 2023-03-19 DIAGNOSIS — R1031 Right lower quadrant pain: Secondary | ICD-10-CM | POA: Diagnosis present

## 2023-03-19 LAB — URINALYSIS, ROUTINE W REFLEX MICROSCOPIC
Bilirubin Urine: NEGATIVE
Glucose, UA: NEGATIVE mg/dL
Hgb urine dipstick: NEGATIVE
Ketones, ur: NEGATIVE mg/dL
Leukocytes,Ua: NEGATIVE
Nitrite: NEGATIVE
Protein, ur: NEGATIVE mg/dL
Specific Gravity, Urine: 1.007 (ref 1.005–1.030)
pH: 6.5 (ref 5.0–8.0)

## 2023-03-19 LAB — COMPREHENSIVE METABOLIC PANEL
ALT: 10 U/L (ref 0–44)
AST: 13 U/L — ABNORMAL LOW (ref 15–41)
Albumin: 4.8 g/dL (ref 3.5–5.0)
Alkaline Phosphatase: 51 U/L (ref 38–126)
Anion gap: 8 (ref 5–15)
BUN: 12 mg/dL (ref 6–20)
CO2: 24 mmol/L (ref 22–32)
Calcium: 9.4 mg/dL (ref 8.9–10.3)
Chloride: 103 mmol/L (ref 98–111)
Creatinine, Ser: 0.61 mg/dL (ref 0.44–1.00)
GFR, Estimated: >60 mL/min (ref >60)
Glucose, Bld: 89 mg/dL (ref 70–99)
Potassium: 4 mmol/L (ref 3.5–5.1)
Sodium: 135 mmol/L (ref 135–145)
Total Bilirubin: 0.3 mg/dL (ref 0.0–1.2)
Total Protein: 7.2 g/dL (ref 6.5–8.1)

## 2023-03-19 LAB — CBC
HCT: 38.4 % (ref 36.0–46.0)
Hemoglobin: 12.9 g/dL (ref 12.0–15.0)
MCH: 29.7 pg (ref 26.0–34.0)
MCHC: 33.6 g/dL (ref 30.0–36.0)
MCV: 88.3 fL (ref 80.0–100.0)
Platelets: 443 10*3/uL — ABNORMAL HIGH (ref 150–400)
RBC: 4.35 MIL/uL (ref 3.87–5.11)
RDW: 12.4 % (ref 11.5–15.5)
WBC: 6.8 10*3/uL (ref 4.0–10.5)
nRBC: 0 % (ref 0.0–0.2)

## 2023-03-19 LAB — PREGNANCY, URINE: Preg Test, Ur: NEGATIVE

## 2023-03-19 LAB — LIPASE, BLOOD: Lipase: 54 U/L — ABNORMAL HIGH (ref 11–51)

## 2023-03-19 MED ORDER — ONDANSETRON 4 MG PO TBDP: 4.0000 mg | ORAL_TABLET | Freq: Three times a day (TID) | ORAL | 0 refills | Status: DC | PRN

## 2023-03-19 MED ORDER — MORPHINE SULFATE (PF) 4 MG/ML IV SOLN
4.0000 mg | Freq: Once | INTRAVENOUS | Status: DC
  Filled 2023-03-19: qty 1

## 2023-03-19 MED ORDER — ONDANSETRON 4 MG PO TBDP
8.0000 mg | ORAL_TABLET | Freq: Once | ORAL | Status: AC
  Administered 2023-03-19: 8 mg via ORAL
  Filled 2023-03-19: qty 2

## 2023-03-19 MED ORDER — KETOROLAC TROMETHAMINE 10 MG PO TABS: 10.0000 mg | ORAL_TABLET | Freq: Four times a day (QID) | ORAL | 0 refills | Status: DC | PRN

## 2023-03-19 MED ORDER — HYDROCODONE-ACETAMINOPHEN 5-325 MG PO TABS
1.0000 | ORAL_TABLET | Freq: Once | ORAL | Status: AC
  Administered 2023-03-19: 1 via ORAL
  Filled 2023-03-19: qty 1

## 2023-03-19 MED ORDER — ONDANSETRON HCL 4 MG/2ML IJ SOLN
4.0000 mg | Freq: Once | INTRAMUSCULAR | Status: DC
  Filled 2023-03-19: qty 2

## 2023-03-19 MED ORDER — TRAMADOL HCL 50 MG PO TABS: 50.0000 mg | ORAL_TABLET | Freq: Four times a day (QID) | ORAL | 0 refills | Status: DC | PRN

## 2023-03-19 NOTE — ED Triage Notes (Signed)
 Pt c/o "sharp" pain RLQ pain today with nausea, reports hx of ovarian cyst. Tramadol and 400 mg at 0750

## 2023-03-19 NOTE — ED Provider Notes (Signed)
 [place] [facility] AT [facility] Provider Note   CSN: [id number] Arrival date & time: 03/19/23  1637     History  Chief Complaint  Patient presents with   Abdominal Pain    Gabriela Matthews is a 23 y.o. female.   Abdominal Pain   23 year old female presents emergency department with complaints of right lower abdominal pain.  States that pain began this morning around 9 or 10 AM.  Reports abrupt onset sharp pain.  States that pain was severe but has since let off significantly.  States she took a tramadol which did help with her symptoms.  States that she felt a "warm sensation in my belly" when the pain improved.  She states that she feels like her cyst may have ruptured.  Patient is with history of right-sided ovarian cyst that was planned to be operated on by OB/GYN later this month.  Patient denies any fever, chills, vomiting, urinary symptoms, vaginal symptoms, change in bowel habits.  History significant for ADHD, OCD, ODD, trichotillomania, ovarian cyst  Home Medications Prior to Admission medications   Medication Sig Start Date End Date Taking? Authorizing Provider  traMADol (ULTRAM) 50 MG tablet Take 1 tablet (50 mg total) by mouth every 6 (six) hours as needed. 03/19/23  Yes Sherian Maroon A, PA  amphetamine-dextroamphetamine (ADDERALL XR) 30 MG 24 hr capsule Take 20 mg by mouth daily.     [provider]  EPINEPHrine 0.3 mg/0.3 mL IJ SOAJ injection Inject 0.3 mg into the muscle as needed for anaphylaxis. 09/15/22   Netta Corrigan, PA-C  escitalopram (LEXAPRO) 20 MG tablet Take 20 mg by mouth every morning. 11/13/17   [provider]  ibuprofen (ADVIL,MOTRIN) 100 MG/5ML suspension Take 5 mg/kg by mouth every 6 (six) hours as needed. For pain    [provider]  naproxen (NAPROSYN) 375 MG tablet Take 1 tablet (375 mg total) by mouth 3 (three) times daily with meals. 11/06/22   Alvira Monday, MD  ondansetron (ZOFRAN-ODT) 4 MG  disintegrating tablet Take 1 tablet (4 mg total) by mouth every 8 (eight) hours as needed. 03/19/23   Peter Garter, PA  Oxcarbazepine (TRILEPTAL) 300 MG tablet Take 450-1,050 mg by mouth 2 (two) times daily. 450 mg in the am and 1050 mg at bedtime 11/04/17   [provider]  predniSONE (DELTASONE) 10 MG tablet Take 2 tablets (20 mg total) by mouth daily. 09/15/22   Netta Corrigan, PA-C  promethazine-dextromethorphan (PROMETHAZINE-DM) 6.25-15 MG/5ML syrup Take 5 mLs by mouth 2 (two) times daily as needed for cough. 02/25/22   Raspet, Noberto Retort, PA-C      Allergies    Gabapentin and Other    Review of Systems   Review of Systems  Gastrointestinal:  Positive for abdominal pain.  All other systems reviewed and are negative.   Physical Exam Updated Vital Signs BP 138/88 (BP Location: Right Arm)   Pulse 88   Temp 98.7 F (37.1 C) (Oral)   Resp 16   SpO2 99%  Physical Exam Vitals and nursing note reviewed.  Constitutional:      General: She is not in acute distress.    Appearance: She is well-developed.  HENT:     Head: Normocephalic and atraumatic.  Eyes:     Conjunctiva/sclera: Conjunctivae normal.  Cardiovascular:     Rate and Rhythm: Normal rate and regular rhythm.     Heart sounds: No murmur heard. Pulmonary:     Effort: Pulmonary effort is  normal. No respiratory distress.     Breath sounds: Normal breath sounds.  Abdominal:     Palpations: Abdomen is soft.     Tenderness: There is abdominal tenderness in the right lower quadrant. There is no right CVA tenderness, left CVA tenderness or guarding.  Musculoskeletal:        General: No swelling.     Cervical back: Neck supple.  Skin:    General: Skin is warm and dry.     Capillary Refill: Capillary refill takes less than 2 seconds.  Neurological:     Mental Status: She is alert.  Psychiatric:        Mood and Affect: Mood normal.     ED Results / Procedures / Treatments   Labs (all labs ordered are listed,  but only abnormal results are displayed) Labs Reviewed  LIPASE, BLOOD - Abnormal; Notable for the following components:      Result Value   Lipase 54 (*)    All other components within normal limits  COMPREHENSIVE METABOLIC PANEL - Abnormal; Notable for the following components:   AST 13 (*)    All other components within normal limits  CBC - Abnormal; Notable for the following components:   Platelets 443 (*)    All other components within normal limits  URINALYSIS, ROUTINE W REFLEX MICROSCOPIC - Abnormal; Notable for the following components:   Color, Urine COLORLESS (*)    All other components within normal limits  PREGNANCY, URINE    EKG None  Radiology US PELVIC COMPLETE W TRANSVAGINAL AND TORSION R/O Result Date: 03/19/2023 CLINICAL DATA:  Right lower quadrant pain EXAM: TRANSABDOMINAL AND TRANSVAGINAL ULTRASOUND OF PELVIS DOPPLER ULTRASOUND OF OVARIES TECHNIQUE: Both transabdominal and transvaginal ultrasound examinations of the pelvis were performed. Transabdominal technique was performed for global imaging of the pelvis including uterus, ovaries, adnexal regions, and pelvic cul-de-sac. It was necessary to proceed with endovaginal exam following the transabdominal exam to visualize the ovaries and endometrium. Color and duplex Doppler ultrasound was utilized to evaluate blood flow to the ovaries. COMPARISON:  CT abdomen and pelvis 02/11/2023. Pelvic ultrasound 11/06/2022. FINDINGS: Uterus Measurements: 6.9 x 3.7 x 4.6 cm = volume: 62 mL. No fibroids or other mass visualized. Endometrium Thickness: 7 mm.  No focal abnormality visualized. Right ovary Measurements: 5.9 x 4.9 x 5.4 cm = volume: 80 mL. Within the right ovary there is a 5.6 x 3.7 x 5.8 cm hypoechoic structure with internal echoes and multiple septations. Overall measurements are 5.6 x 3.7 x 5.8 cm. Appearance is similar to the prior ultrasound. Left ovary Measurements: 2.3 x 2.0 x 2.6 cm = volume: 6.1 mL. Normal appearance/no  adnexal mass. Pulsed Doppler evaluation of both ovaries demonstrates normal low-resistance arterial and venous waveforms. Other findings No abnormal free fluid. IMPRESSION: 1. No evidence for ovarian torsion. 2. Stable indeterminate complex cystic lesion in the right ovary measuring up to 5.8 cm. This is minimally increased in size compared to 11/06/2022. Recommend further characterization with MRI. Electronically Signed   By: Darliss Cheney M.D.   On: 03/19/2023 22:11    Procedures Procedures    Medications Ordered in ED Medications  HYDROcodone-acetaminophen (NORCO/VICODIN) 5-325 MG per tablet 1 tablet (1 tablet Oral Given 03/19/23 1931)  ondansetron (ZOFRAN-ODT) disintegrating tablet 8 mg (8 mg Oral Given 03/19/23 1931)    ED Course/ Medical Decision Making/ A&P  Medical Decision Making Amount and/or Complexity of Data Reviewed Labs: ordered. Radiology: ordered.  Risk Prescription drug management.   This patient presents to the ED for concern of right lower abdominal pain, this involves an extensive number of treatment options, and is a complaint that carries with it a high risk of complications and morbidity.  The differential diagnosis includes ectopic pregnancy, ovarian torsion, appendicitis, tubo-ovarian abscess, diverticulitis, SBO/LBO, volvulus, other   Co morbidities that complicate the patient evaluation  See HPI   Additional history obtained:  Additional history obtained from EMR External records from outside source obtained and reviewed including hospital records   Lab Tests:  I Ordered, and personally interpreted labs.  The pertinent results include: No leukocytosis.  No evidence of anemia.  Thrombocytosis of 443.  No Electra abnormalities.  No transaminitis.  No renal dysfunction.  Lipase mildly elevated at 54.  UA without abnormality.  Urine pregnancy negative.   Imaging Studies ordered:  I ordered imaging studies including  pelvic ultrasound I independently visualized and interpreted imaging which showed no evidence of ovarian torsion.  Complex right sided ovarian cyst measuring up to 5.8 cm. I agree with the radiologist interpretation  Cardiac Monitoring: / EKG:  The patient was maintained on a cardiac monitor.  I personally viewed and interpreted the cardiac monitored which showed an underlying rhythm of: Sinus rhythm   Consultations Obtained:  N/a   Problem List / ED Course / Critical interventions / Medication management  Right lower abdominal pain I ordered medication including Zofran, Norco   Reevaluation of the patient after these medicines showed that the patient improved I have reviewed the patients home medicines and have made adjustments as needed   Social Determinants of Health:  Denies tobacco, licit drug use.   Test / Admission - Considered:  Right lower abdominal pain Vitals signs within normal range and stable throughout visit. Laboratory/imaging studies significant for: See above 23 year old female presents emergency department with complaints of right lower abdominal pain.  States that pain began this morning around 9 or 10 AM.  Reports abrupt onset sharp pain.  States that pain was severe but has since let off significantly.  States she took a tramadol which did help with her symptoms.  States that she felt a "warm sensation in my belly" when the pain improved.  She states that she feels like her cyst may have ruptured.  Patient is with history of right-sided ovarian cyst that was planned to be operated on by OB/GYN later this month.  Patient denies any fever, chills, vomiting, urinary symptoms, vaginal symptoms, change in bowel habits. On exam, patient with tenderness right lower quadrant of abdomen/right adnexal region.  Laboratory studies reassuring without evidence of acute emergent process.  Pelvic ultrasound showed evidence of stable right-sided ovarian cyst.  Patient treated  with medications and noted resolution of symptoms.  After pelvic ultrasound imaging had resulted after 6+ hour stay while emergency department, was discussing results with patient and gauging desire for additional CT imaging.  Patient reassured by negative ultrasound findings.  Reassured by history given that pain feels very similar to prior ovarian pain that she has had in the past related to cyst.  Still concern for other etiologies of the patient's right lower abdominal pain such as appendicitis despite resolution of pain, lack of leukocytosis.  Patient elected to manage symptoms at home and return if pain returns, fever, tractable nausea, vomiting for further assessment with CT scan given prolonged imaging turnaround time.  Would recommend  follow-up with OB/GYN for repeat assessment regarding ovarian cyst; does have planned procedure later this month for cyst removal.  Treatment plan discussed at length with patient and she acknowledged understanding was agreeable to said plan.  Patient overall well-appearing, afebrile in no acute distress. Worrisome signs and symptoms were discussed with the patient, and the patient acknowledged understanding to return to the ED if noticed. Patient was stable upon discharge.          Final Clinical Impression(s) / ED Diagnoses Final diagnoses:  Right lower quadrant abdominal pain    Rx / DC Orders ED Discharge Orders     None         Peter Garter, [place] 03/19/23 [place]    Arby Barrette, MD 03/21/23 1724

## 2023-03-19 NOTE — ED Notes (Signed)
 Patient transported to [place]

## 2023-03-19 NOTE — Discharge Instructions (Signed)
 As discussed, ultrasound appeared well.  Your ovary still had a cyst on it that is slightly enlarged from last time.  Will send you home medicine to use as needed for pain.  If you develop fever, return of pain, please return to the emergency department for further assessment regarding other things that can cause right lower belly pain.  Recommend follow-up with your OB/GYN for reassessment.  Please do not hesitate to return if the worrisome signs and symptoms we discussed become apparent.

## 2023-03-26 ENCOUNTER — Encounter: Payer: Self-pay | Admitting: Physical Therapy

## 2023-03-26 ENCOUNTER — Ambulatory Visit: Payer: Medicaid Other | Attending: Sports Medicine | Admitting: Physical Therapy

## 2023-03-26 ENCOUNTER — Other Ambulatory Visit: Payer: Self-pay

## 2023-03-26 DIAGNOSIS — R102 Pelvic and perineal pain: Secondary | ICD-10-CM | POA: Diagnosis present

## 2023-03-26 DIAGNOSIS — M62838 Other muscle spasm: Secondary | ICD-10-CM | POA: Insufficient documentation

## 2023-03-26 DIAGNOSIS — R279 Unspecified lack of coordination: Secondary | ICD-10-CM | POA: Diagnosis present

## 2023-03-26 NOTE — Therapy (Signed)
 OUTPATIENT PHYSICAL THERAPY FEMALE PELVIC EVALUATION   Patient Name: Gabriela Matthews MRN: [id number] DOB:Dec 20, 2000, 23 y.o., female Today's Date: 03/26/2023  END OF SESSION:  PT End of Session - 03/26/23 0923     Visit Number 1    Number of Visits 9    Authorization Type [place] [employer]    Authorization - Number of Visits 27    PT Start Time 0930    PT Stop Time 1015    PT Time Calculation (min) 45 min    Activity Tolerance Patient tolerated treatment well    Behavior During Therapy WFL for tasks assessed/performed             Past Medical History:  Diagnosis Date   ADHD (attention deficit hyperactivity disorder)    Anxiety    Asthma    Depression    OCD (obsessive compulsive disorder)    ODD (oppositional defiant disorder)    Trichotillomania    Past Surgical History:  Procedure Laterality Date   INNER EAR SURGERY     TONSILLECTOMY     There are no active problems to display for this patient.   PCP: [facility] [place]   REFERRING PROVIDER: Hurman Horn, MD   REFERRING DIAG: [phone] (ICD-10-CM) - Pelvic floor dysfunction  THERAPY DIAG:  Unspecified lack of coordination  Other muscle spasm  Pelvic pain  Rationale for Evaluation and Treatment: Rehabilitation  ONSET DATE: June of 2024   SUBJECTIVE:                                                                                                                                                                                           SUBJECTIVE STATEMENT: "Gabriela Matthews"  Patient reports to pelvic PT due to pain secondary to possible endometriosis. She is having surgery for the endometriosis on 04/09/23.  Fluid intake: seeing a nutritionist and working on water intake - she does not like water so she is working on this (2-3 cups per day), 1 diet Dr. Reino Kent per day   PAIN:  Are you having pain? Yes NPRS scale: 2/10 current, 8-10/10 at worst Pain location: Internal and Bilateral  Pain type:  sharp Pain description: intermittent   Aggravating factors: working on feet for 8 hours  Relieving factors: painkillers, heat, sitting down   PRECAUTIONS: None  RED FLAGS: None   WEIGHT BEARING RESTRICTIONS: No  FALLS:  Has patient fallen in last 6 months? No  OCCUPATION: fast food, 8 hour shifts on feet, 5 days a week    ACTIVITY LEVEL : walks over an hour daily   PLOF: Independent with basic ADLs  PATIENT GOALS: decreased  pain, be less tense in the pelvic floor   PERTINENT HISTORY:  Hx: ADHD (attention deficit hyperactivity disorder), Anxiety, Asthma, Depression, OCD (obsessive compulsive disorder, ODD (oppositional defiant disorder), Trichotillomania  Sexual abuse: No  BOWEL MOVEMENT: Pain with bowel movement: No Type of bowel movement:Type (Bristol Stool Scale) 4, Frequency 1-2x/week which is a new thing, Strain no , and Splinting no  Fully empty rectum: No Leakage: No Pads: No Fiber supplement/laxative No  URINATION: Pain with urination: No Fully empty bladder: Yes:   Stream: Strong Urgency: Yes  Frequency: within normal limits  Leakage: none Pads: No  INTERCOURSE: not currently sexually active   Ability to have vaginal penetration Yes  Pain with intercourse: Initial Penetration - during transvaginal US  DrynessNo Climax: yes  Marinoff Scale: 0/3  PREGNANCY: Vaginal deliveries 0 Currently pregnant No  PROLAPSE: None  OBJECTIVE:  Note: Objective measures were completed at Evaluation unless otherwise noted.  PATIENT SURVEYS:  PFIQ-7: 44  COGNITION: Overall cognitive status: Within functional limits for tasks assessed     SENSATION: Light touch: Appears intact  LUMBAR SPECIAL TESTS:  Single leg stance test: Positive  FUNCTIONAL TESTS:  Squat:   GAIT: Assistive device utilized: None Comments: mild trendelenburg gait pattern with ambulation   POSTURE: rounded shoulders and forward head   LUMBARAROM/PROM: within functional limits    A/PROM A/PROM  eval  Flexion   Extension   Right lateral flexion   Left lateral flexion   Right rotation   Left rotation    (Blank rows = not tested)  LOWER EXTREMITY ROM: within normal limits   Active ROM Right eval Left eval  Hip flexion    Hip extension    Hip abduction    Hip adduction    Hip internal rotation    Hip external rotation    Knee flexion    Knee extension    Ankle dorsiflexion    Ankle plantarflexion    Ankle inversion    Ankle eversion     (Blank rows = not tested)  LOWER EXTREMITY MMT: 4+/5 bilateral knees and hips grossly  MMT Right eval Left eval  Hip flexion    Hip extension    Hip abduction    Hip adduction    Hip internal rotation    Hip external rotation    Knee flexion    Knee extension    Ankle dorsiflexion    Ankle plantarflexion    Ankle inversion    Ankle eversion     (Blank rows = not tested) PALPATION:   General: increased tone present in bilateral adductors/ hip flexors   Pelvic Alignment: within normal limits   Abdominal: upper chest breathing, decreased rib excursion, abdominal bracing at rest                 External Perineal Exam: minimal dryness noted                              Internal Pelvic Floor: overactivity noted throughout, mild pain at introitus that decreased with relaxation of musculature, lack of coordination present between pelvic floor and diaphragm, increased muscle tension preventing full active range of motion  Patient confirms identification and approves PT to assess internal pelvic floor and treatment Yes No emotional/communication barriers or cognitive limitation. Patient is motivated to learn. Patient understands and agrees with treatment goals and plan. PT explains patient will be examined in standing, sitting, and lying down to see  how their muscles and joints work. When they are ready, they will be asked to remove their underwear so PT can examine their perineum. The patient is also given the  option of providing their own chaperone as one is not provided in our facility. The patient also has the right and is explained the right to defer or refuse any part of the evaluation or treatment including the internal exam. With the patient's consent, PT will use one gloved finger to gently assess the muscles of the pelvic floor, seeing how well it contracts and relaxes and if there is muscle symmetry. After, the patient will get dressed and PT and patient will discuss exam findings and plan of care. PT and patient discuss plan of care, schedule, attendance policy and HEP activities.  PELVIC MMT:   MMT eval  Vaginal Strength testing not assessed due to high pelvic floor muscle tone throughout   Internal Anal Sphincter   External Anal Sphincter   Puborectalis   Diastasis Recti   (Blank rows = not tested)        TONE: High   PROLAPSE: none  TODAY'S TREATMENT:                                                                                                                              DATE:   EVAL 03/26/23: Examination completed, findings reviewed, pt educated on POC. Pt motivated to participate in PT and agreeable to attempt recommendations.    PATIENT EDUCATION:  Education details: relative pelvic floor anatomy, connection between the pelvic floor and diaphragm, pelvic floor connection to intraabdominal pressure, endometriosis pain management and how the pelvic floor muscles can affect this  Person educated: Patient Education method: Explanation, Demonstration, Tactile cues, Verbal cues, and Handouts Education comprehension: verbalized understanding, returned demonstration, verbal cues required, tactile cues required, and needs further education  HOME EXERCISE PROGRAM: Access Code: [email or web address] URL: [email or web address] Date: 03/26/2023 Prepared by: Earna Coder  Exercises - Supine Diaphragmatic Breathing  - 1 x daily - 7 x weekly - 2 sets - 10  reps  ASSESSMENT:  CLINICAL IMPRESSION: Patient is a 23 y.o. female  who was seen today for physical therapy evaluation and treatment for pelvic pain secondary to potential endometriosis. Patient is having surgery for endo on 03/12/23, and if she is able to get into PT before surgery, we will continue prehabilitation treatment for post-op recovery. If she returns after surgery, a post-op reassessment will be performed once pt is cleared for PT. Patient reports consistent pelvic pain bilaterally for years, increasing in April of 2024. Her pain fluctuates between 2-10/10, currently 2/10 at rest. Patient demonstrates moderate lumbopelvic weakness/stiffness and pelvic floor muscle overactivity throughout superficial and deep pelvic floor muscles. Mild pain with palpation of pelvic floor muscle layer 1, but this pain did not persist and did not return following examination. Patient demonstrates stiffness and lack of coordination/range of motion in pelvic floor. Patient educated on diaphragmatic breathing techniques  for pelvic floor relaxation - she had a difficult time performing this as opposed to upper chest breathing. Overall, patient tolerated treatment well and Pt would benefit from additional PT to further address deficits.   OBJECTIVE IMPAIRMENTS: decreased coordination, decreased endurance, decreased mobility, decreased ROM, decreased strength, and pain.   ACTIVITY LIMITATIONS: standing and transfers  PARTICIPATION LIMITATIONS: occupation  PERSONAL FACTORS: Past/current experiences and 3+ comorbidities: ADHD (attention deficit hyperactivity disorder), Anxiety, Asthma, Depression, OCD (obsessive compulsive disorder, ODD (oppositional defiant disorder), Trichotillomania  are also affecting patient's functional outcome.   REHAB POTENTIAL: Good  CLINICAL DECISION MAKING: Stable/uncomplicated  EVALUATION COMPLEXITY: Low   GOALS: Goals reviewed with patient? Yes  SHORT TERM GOALS: Target date:  04/23/2023  Pt will be independent with HEP.  Baseline: Goal status: INITIAL  2.  Pt will be independent with diaphragmatic breathing and down training activities in order to improve pelvic floor relaxation. Baseline:  Goal status: INITIAL  3.  Pt will report 50% reduction of pain due to improvements in posture, strength, and muscle length in pelvic floor for improved quality of life.  Baseline:  Goal status: INITIAL  LONG TERM GOALS: Target date: 09/26/2023  Pt will be independent with advanced HEP.  Baseline:  Goal status: INITIAL  2.  Pt will report PFIQ-7 score of less than or equal to 35 points to suggest decreased functional limitations secondary to pain to improve quality of life.  Baseline:  Goal status: INITIAL  3.  Pt will report 80% reduction of pain due to improvements in posture, strength, and muscle length in pelvic floor to improve quality of life.  Baseline:  Goal status: INITIAL  4.  Pt will demonstrate normal pelvic floor muscle tone and A/ROM, able to achieve 4/5 strength with contractions and 10 sec endurance, in order to provide appropriate lumbopelvic support in functional activities.   Baseline:  Goal status: INITIAL PLAN:  PT FREQUENCY: 1x/week  PT DURATION: 12 weeks  PLANNED INTERVENTIONS: 97110-Therapeutic exercises, 97530- Therapeutic activity, 97112- Neuromuscular re-education, 97535- Self Care, 96045- Manual therapy, Taping, Dry Needling, Joint mobilization, Spinal mobilization, Scar mobilization, Cryotherapy, and Moist heat  PLAN FOR NEXT SESSION: internal treatment for manual muscle tension relief, stretching for pelvic floor downtraining   Omar Person, PT 03/26/2023, 10:32 AM

## 2023-04-02 ENCOUNTER — Encounter (HOSPITAL_BASED_OUTPATIENT_CLINIC_OR_DEPARTMENT_OTHER): Payer: Self-pay | Admitting: Emergency Medicine

## 2023-04-02 ENCOUNTER — Emergency Department (HOSPITAL_BASED_OUTPATIENT_CLINIC_OR_DEPARTMENT_OTHER)
Admission: EM | Admit: 2023-04-02 | Discharge: 2023-04-02 | Disposition: A | Discharge status: Home / Self Care | Attending: Emergency Medicine | Admitting: Emergency Medicine

## 2023-04-02 ENCOUNTER — Other Ambulatory Visit: Payer: Self-pay

## 2023-04-02 ENCOUNTER — Emergency Department (HOSPITAL_BASED_OUTPATIENT_CLINIC_OR_DEPARTMENT_OTHER)

## 2023-04-02 DIAGNOSIS — N83201 Unspecified ovarian cyst, right side: Secondary | ICD-10-CM | POA: Insufficient documentation

## 2023-04-02 DIAGNOSIS — R102 Pelvic and perineal pain: Secondary | ICD-10-CM | POA: Diagnosis present

## 2023-04-02 LAB — WET PREP, GENITAL
Clue Cells Wet Prep HPF POC: NONE SEEN
Sperm: NONE SEEN
Trich, Wet Prep: NONE SEEN
WBC, Wet Prep HPF POC: <10 (ref <10)
Yeast Wet Prep HPF POC: NONE SEEN

## 2023-04-02 LAB — CBC WITH DIFFERENTIAL/PLATELET
Abs Immature Granulocytes: 0.01 10*3/uL (ref 0.00–0.07)
Basophils Absolute: 0 10*3/uL (ref 0.0–0.1)
Basophils Relative: 0 %
Eosinophils Absolute: 0.1 10*3/uL (ref 0.0–0.5)
Eosinophils Relative: 1 %
HCT: 39.9 % (ref 36.0–46.0)
Hemoglobin: 13.1 g/dL (ref 12.0–15.0)
Immature Granulocytes: 0 %
Lymphocytes Relative: 34 %
Lymphs Abs: 2.5 10*3/uL (ref 0.7–4.0)
MCH: 29.2 pg (ref 26.0–34.0)
MCHC: 32.8 g/dL (ref 30.0–36.0)
MCV: 88.9 fL (ref 80.0–100.0)
Monocytes Absolute: 0.6 10*3/uL (ref 0.1–1.0)
Monocytes Relative: 8 %
Neutro Abs: 4.3 10*3/uL (ref 1.7–7.7)
Neutrophils Relative %: 57 %
Platelets: 450 10*3/uL — ABNORMAL HIGH (ref 150–400)
RBC: 4.49 MIL/uL (ref 3.87–5.11)
RDW: 12.5 % (ref 11.5–15.5)
WBC: 7.5 10*3/uL (ref 4.0–10.5)
nRBC: 0 % (ref 0.0–0.2)

## 2023-04-02 LAB — COMPREHENSIVE METABOLIC PANEL
ALT: 10 U/L (ref 0–44)
AST: 14 U/L — ABNORMAL LOW (ref 15–41)
Albumin: 4.8 g/dL (ref 3.5–5.0)
Alkaline Phosphatase: 60 U/L (ref 38–126)
Anion gap: 7 (ref 5–15)
BUN: 12 mg/dL (ref 6–20)
CO2: 26 mmol/L (ref 22–32)
Calcium: 9.3 mg/dL (ref 8.9–10.3)
Chloride: 104 mmol/L (ref 98–111)
Creatinine, Ser: 0.63 mg/dL (ref 0.44–1.00)
GFR, Estimated: >60 mL/min (ref >60)
Glucose, Bld: 101 mg/dL — ABNORMAL HIGH (ref 70–99)
Potassium: 3.7 mmol/L (ref 3.5–5.1)
Sodium: 137 mmol/L (ref 135–145)
Total Bilirubin: 0.2 mg/dL (ref 0.0–1.2)
Total Protein: 7.2 g/dL (ref 6.5–8.1)

## 2023-04-02 LAB — URINALYSIS, ROUTINE W REFLEX MICROSCOPIC
Bacteria, UA: NONE SEEN
Bilirubin Urine: NEGATIVE
Glucose, UA: NEGATIVE mg/dL
Ketones, ur: NEGATIVE mg/dL
Nitrite: NEGATIVE
Protein, ur: NEGATIVE mg/dL
Specific Gravity, Urine: 1.014 (ref 1.005–1.030)
pH: 6.5 (ref 5.0–8.0)

## 2023-04-02 LAB — PREGNANCY, URINE: Preg Test, Ur: NEGATIVE

## 2023-04-02 MED ORDER — IBUPROFEN 600 MG PO TABS: 600.0000 mg | ORAL_TABLET | Freq: Four times a day (QID) | ORAL | 0 refills | Status: AC | PRN

## 2023-04-02 MED ORDER — HYDROCODONE-ACETAMINOPHEN 5-325 MG PO TABS: 1.0000 | ORAL_TABLET | Freq: Four times a day (QID) | ORAL | 0 refills | Status: DC | PRN

## 2023-04-02 MED ORDER — IBUPROFEN 400 MG PO TABS
600.0000 mg | ORAL_TABLET | Freq: Once | ORAL | Status: AC
  Administered 2023-04-02: 600 mg via ORAL
  Filled 2023-04-02: qty 1

## 2023-04-02 NOTE — ED Triage Notes (Addendum)
 Patient c/o fatigue and suprapubic pain that started this morning. Patient denies dysuria.  Patient has a history of a large right ovarian cyst. Patient here on 3/7 for same and has surgery scheduled for next week.  Patient pale in triage. Patient also endorses light pink vaginal discharge.

## 2023-04-02 NOTE — ED Provider Notes (Signed)
 [place] EMERGENCY DEPARTMENT AT [facility] Provider Note   CSN: [id number] Arrival date & time: 04/02/23  1625     History  Chief Complaint  Patient presents with   Pelvic Pain    Gabriela Matthews is a 23 y.o. female.  Patient to ED with lower abdominal/suprapubic pain that stared this morning. No fever or vomiting. She denies urinary symptoms. She reports history of ovarian cyst on the right and has pending surgery for same. Today she noticed a pinkish discharge and wanted to ensure nothing new was going on.   The history is provided by the patient. No language interpreter was used.  Pelvic Pain       Home Medications Prior to Admission medications   Medication Sig Start Date End Date Taking? Authorizing Provider  HYDROcodone-acetaminophen (NORCO/VICODIN) 5-325 MG tablet Take 1 tablet by mouth every 6 (six) hours as needed. 04/02/23  Yes Makar Slatter, Melvenia Beam, PA-C  ibuprofen (ADVIL) 600 MG tablet Take 1 tablet (600 mg total) by mouth every 6 (six) hours as needed. 04/02/23  Yes Elpidio Anis, PA-C  amphetamine-dextroamphetamine (ADDERALL XR) 30 MG 24 hr capsule Take 20 mg by mouth daily.     [provider]  EPINEPHrine 0.3 mg/0.3 mL IJ SOAJ injection Inject 0.3 mg into the muscle as needed for anaphylaxis. 09/15/22   Netta Corrigan, PA-C  escitalopram (LEXAPRO) 20 MG tablet Take 20 mg by mouth every morning. 11/13/17   [provider]  naproxen (NAPROSYN) 375 MG tablet Take 1 tablet (375 mg total) by mouth 3 (three) times daily with meals. 11/06/22   Alvira Monday, MD  ondansetron (ZOFRAN-ODT) 4 MG disintegrating tablet Take 1 tablet (4 mg total) by mouth every 8 (eight) hours as needed. 03/19/23   Peter Garter, PA  Oxcarbazepine (TRILEPTAL) 300 MG tablet Take 450-1,050 mg by mouth 2 (two) times daily. 450 mg in the am and 1050 mg at bedtime 11/04/17   [provider]  predniSONE (DELTASONE) 10 MG tablet Take 2 tablets (20 mg total) by mouth  daily. 09/15/22   Netta Corrigan, PA-C  promethazine-dextromethorphan (PROMETHAZINE-DM) 6.25-15 MG/5ML syrup Take 5 mLs by mouth 2 (two) times daily as needed for cough. 02/25/22   Raspet, Noberto Retort, PA-C      Allergies    Gabapentin and Other    Review of Systems   Review of Systems  Genitourinary:  Positive for pelvic pain.    Physical Exam Updated Vital Signs BP (!) 137/99   Pulse 97   Temp 98.3 F (36.8 C) (Oral)   Resp 16   Ht 5\' 4"  (1.626 m)   Wt 73 kg   SpO2 100%   BMI 27.64 kg/m  Physical Exam Vitals and nursing note reviewed.  Constitutional:      General: She is not in acute distress.    Appearance: She is well-developed. She is not ill-appearing.  Pulmonary:     Effort: Pulmonary effort is normal.  Abdominal:     General: There is no distension.     Palpations: Abdomen is soft.     Tenderness: There is no abdominal tenderness.  Genitourinary:    Comments: Speculum exam limited by patient discomfort with the exam. No visualized copious discharge seen given this limitation. Vaginal swabs obtained.  Musculoskeletal:        General: Normal range of motion.     Cervical back: Normal range of motion.  Skin:    General: Skin is warm and dry.  Neurological:  Mental Status: She is alert and oriented to person, place, and time.     ED Results / Procedures / Treatments   Labs (all labs ordered are listed, but only abnormal results are displayed) Labs Reviewed  URINALYSIS, ROUTINE W REFLEX MICROSCOPIC - Abnormal; Notable for the following components:      Result Value   Hgb urine dipstick SMALL (*)    Leukocytes,Ua TRACE (*)    All other components within normal limits  CBC WITH DIFFERENTIAL/PLATELET - Abnormal; Notable for the following components:   Platelets 450 (*)    All other components within normal limits  COMPREHENSIVE METABOLIC PANEL - Abnormal; Notable for the following components:   Glucose, Bld 101 (*)    AST 14 (*)    All other components  within normal limits  WET PREP, GENITAL  PREGNANCY, URINE  GC/CHLAMYDIA PROBE AMP ([place]) NOT AT [facility]    EKG None  Radiology US PELVIC COMPLETE W TRANSVAGINAL AND TORSION R/O Result Date: 04/02/2023 CLINICAL DATA:  Right-sided pelvic pain EXAM: TRANSABDOMINAL AND TRANSVAGINAL ULTRASOUND OF PELVIS DOPPLER ULTRASOUND OF OVARIES TECHNIQUE: Both transabdominal and transvaginal ultrasound examinations of the pelvis were performed. Transabdominal technique was performed for global imaging of the pelvis including uterus, ovaries, adnexal regions, and pelvic cul-de-sac. It was necessary to proceed with endovaginal exam following the transabdominal exam to visualize the ovaries. Color and duplex Doppler ultrasound was utilized to evaluate blood flow to the ovaries. COMPARISON:  03/19/2023 FINDINGS: Uterus Measurements: 6.9 x 4.0 x 4.1 cm. = volume: 60 mL. No fibroids or other mass visualized. Endometrium Thickness: 6 mm.  No focal abnormality visualized. Right ovary Measurements: 5.9 x 4.4 x 4.9 cm. = volume: 67 mL. There is a 5.4 cm mildly septated cystic structure within the right adnexa. The overall appearance is stable from the prior exam. Left ovary Measurements: 2.2 x 1.2 x 2.2 cm. = volume: 3 mL. Normal appearance/no adnexal mass. Pulsed Doppler evaluation of both ovaries demonstrates normal low-resistance arterial and venous waveforms. Other findings No abnormal free fluid. IMPRESSION: Stable mildly septated cystic structure within the right ovary measuring up to 5.4 cm. Electronically Signed   By: Alcide Clever M.D.   On: 04/02/2023 20:03    Procedures Procedures    Medications Ordered in ED Medications  ibuprofen (ADVIL) tablet 600 mg (600 mg Oral Given 04/02/23 2126)    ED Course/ Medical Decision Making/ A&P Clinical Course as of 04/03/23 2319  Sat Apr 03, 2023  2316 Patient to ED with symptoms as detailed in the HPI. VSS, no fever. Limited pelvic exam however, ultrasound negative  for torsion, positive for known ovarian cyst. Wet prep negative for evidence of infection. She is felt stable for discharge home to continue GYN follow up outpatient.  [SU]    Clinical Course User Index [SU] Elpidio Anis, PA-C                                 Medical Decision Making Amount and/or Complexity of Data Reviewed Labs: ordered.  Risk Prescription drug management.           Final Clinical Impression(s) / ED Diagnoses Final diagnoses:  Pelvic pain  Cyst of right ovary    Rx / DC Orders ED Discharge Orders          Ordered    ibuprofen (ADVIL) 600 MG tablet  Every 6 hours PRN  04/02/23 2155    HYDROcodone-acetaminophen (NORCO/VICODIN) 5-325 MG tablet  Every 6 hours PRN        04/02/23 2155              Elpidio Anis, PA-C 04/03/23 2319    Anders Simmonds T, DO 04/06/23 [phone]

## 2023-04-02 NOTE — Discharge Instructions (Signed)
 Your labs are reassuring and without any concerning abnormality, including no evidence of infection, or ovarian torsion. Keep you pending appointment for surgery of the ovarian cyst. Take ibuprofen 600 mg for pain, and Norco for severe pain.   Return to the ED with any high fever, severe pain or new symptom of concern.

## 2023-04-02 NOTE — ED Notes (Signed)
 Patient in [place].

## 2023-04-05 LAB — GC/CHLAMYDIA PROBE AMP (~~LOC~~) NOT AT ARMC
Chlamydia: NEGATIVE
Comment: NEGATIVE
Comment: NORMAL
Neisseria Gonorrhea: NEGATIVE

## 2023-06-16 ENCOUNTER — Encounter: Payer: Self-pay | Admitting: Oncology

## 2023-06-16 ENCOUNTER — Inpatient Hospital Stay

## 2023-06-16 ENCOUNTER — Inpatient Hospital Stay: Attending: Oncology | Admitting: Oncology

## 2023-06-16 VITALS — BP 133/90 | HR 106 | Temp 98.7°F | Resp 18 | Ht 64.0 in | Wt 165.4 lb

## 2023-06-16 DIAGNOSIS — G8929 Other chronic pain: Secondary | ICD-10-CM | POA: Insufficient documentation

## 2023-06-16 DIAGNOSIS — D75839 Thrombocytosis, unspecified: Secondary | ICD-10-CM | POA: Insufficient documentation

## 2023-06-16 DIAGNOSIS — Z79899 Other long term (current) drug therapy: Secondary | ICD-10-CM | POA: Diagnosis not present

## 2023-06-16 DIAGNOSIS — R109 Unspecified abdominal pain: Secondary | ICD-10-CM | POA: Insufficient documentation

## 2023-06-16 LAB — CMP (CANCER CENTER ONLY)
ALT: 10 U/L (ref 0–44)
AST: 14 U/L — ABNORMAL LOW (ref 15–41)
Albumin: 4.8 g/dL (ref 3.5–5.0)
Alkaline Phosphatase: 60 U/L (ref 38–126)
Anion gap: 6 (ref 5–15)
BUN: 9 mg/dL (ref 6–20)
CO2: 26 mmol/L (ref 22–32)
Calcium: 9.5 mg/dL (ref 8.9–10.3)
Chloride: 98 mmol/L (ref 98–111)
Creatinine: 0.64 mg/dL (ref 0.44–1.00)
GFR, Estimated: >60 mL/min (ref >60)
Glucose, Bld: 97 mg/dL (ref 70–99)
Potassium: 4.2 mmol/L (ref 3.5–5.1)
Sodium: 130 mmol/L — ABNORMAL LOW (ref 135–145)
Total Bilirubin: 0.4 mg/dL (ref 0.0–1.2)
Total Protein: 7.7 g/dL (ref 6.5–8.1)

## 2023-06-16 LAB — CBC WITH DIFFERENTIAL (CANCER CENTER ONLY)
Abs Immature Granulocytes: 0 10*3/uL (ref 0.00–0.07)
Basophils Absolute: 0 10*3/uL (ref 0.0–0.1)
Basophils Relative: 1 %
Eosinophils Absolute: 0.1 10*3/uL (ref 0.0–0.5)
Eosinophils Relative: 1 %
HCT: 39.3 % (ref 36.0–46.0)
Hemoglobin: 13.6 g/dL (ref 12.0–15.0)
Immature Granulocytes: 0 %
Lymphocytes Relative: 32 %
Lymphs Abs: 1.9 10*3/uL (ref 0.7–4.0)
MCH: 28.9 pg (ref 26.0–34.0)
MCHC: 34.6 g/dL (ref 30.0–36.0)
MCV: 83.6 fL (ref 80.0–100.0)
Monocytes Absolute: 0.6 10*3/uL (ref 0.1–1.0)
Monocytes Relative: 11 %
Neutro Abs: 3.2 10*3/uL (ref 1.7–7.7)
Neutrophils Relative %: 55 %
Platelet Count: 470 10*3/uL — ABNORMAL HIGH (ref 150–400)
RBC: 4.7 MIL/uL (ref 3.87–5.11)
RDW: 12.6 % (ref 11.5–15.5)
WBC Count: 5.8 10*3/uL (ref 4.0–10.5)
nRBC: 0 % (ref 0.0–0.2)

## 2023-06-16 LAB — IRON AND IRON BINDING CAPACITY (CC-WL,HP ONLY)
Iron: 133 ug/dL (ref 28–170)
Saturation Ratios: 31 % (ref 10.4–31.8)
TIBC: 434 ug/dL (ref 250–450)
UIBC: 301 ug/dL (ref 148–442)

## 2023-06-16 LAB — C-REACTIVE PROTEIN: CRP: <0.5 mg/dL (ref <1.0)

## 2023-06-16 LAB — SEDIMENTATION RATE: Sed Rate: 0 mm/h (ref 0–22)

## 2023-06-16 LAB — LACTATE DEHYDROGENASE: LDH: 129 U/L (ref 98–192)

## 2023-06-16 LAB — FERRITIN: Ferritin: 41 ng/mL (ref 11–307)

## 2023-06-16 NOTE — Assessment & Plan Note (Addendum)
 Chronic, mild thrombocytosis with platelet counts ranging from 424,000 to 499,000 since October last year.   Differential diagnosis includes iron deficiency, infection, inflammation, medication effects, or bone marrow mutation.   No anemia, infection, inflammation, or splenic surgery. Previous iron supplementation caused mild nausea initially but was otherwise tolerated.  Labs today showed slightly improved platelet count of 470,000.  White count, hemoglobin, MCV are all within normal limits.  CMP grossly unremarkable.  Iron studies show no evidence of iron deficiency.  ESR, CRP, LDH were all within normal limits today.  Given persistent, mild thrombocytosis, we checked JAK2 mutation analysis with reflex testing to rule out primary myeloproliferative neoplasm, although clinically this seems to be less likely.  - Communicate results in two weeks via phone.  - Provide MyChart sign-up code for result access and communication.  RTC in 3 months for follow-up with repeat labs.

## 2023-06-16 NOTE — Progress Notes (Signed)
 [facility]  HEMATOLOGY CLINIC CONSULTATION NOTE  PATIENT NAME: Gabriela Matthews   MR#: [id number] DOB: 20-Feb-2000  DATE OF SERVICE: 06/16/2023  Patient Care Team: Chyrel Craw, NP as PCP - General (Family Medicine)  REASON FOR CONSULTATION/ CHIEF COMPLAINT:  Thrombocytosis  ASSESSMENT & PLAN:  Gabriela Matthews is a 23 y.o. lady with a past medical history of ADHD, anxiety, depression, OCD, trichotillomania, was referred to our service for evaluation of thrombocytosis.  Thrombocytosis Chronic, mild thrombocytosis with platelet counts ranging from 424,000 to 499,000 since October last year.   Differential diagnosis includes iron deficiency, infection, inflammation, medication effects, or bone marrow mutation.   No anemia, infection, inflammation, or splenic surgery. Previous iron supplementation caused mild nausea initially but was otherwise tolerated.  Labs today showed slightly improved platelet count of 470,000.  White count, hemoglobin, MCV are all within normal limits.  CMP grossly unremarkable.  Iron studies show no evidence of iron deficiency.  ESR, CRP, LDH were all within normal limits today.  Given persistent, mild thrombocytosis, we checked JAK2 mutation analysis with reflex testing to rule out primary myeloproliferative neoplasm, although clinically this seems to be less likely.  - Communicate results in two weeks via phone.  - Provide MyChart sign-up code for result access and communication.  RTC in 3 months for follow-up with repeat labs.  Abdominal pain Chronic abdominal pain persisting since laparoscopic surgery for ovarian cyst removal on March 28. No new cysts identified. No associated symptoms such as early satiety or left-sided pain. Under gynecological care for this issue.  I reviewed lab results and outside records for this visit and discussed relevant results with the patient. Diagnosis, plan of care and treatment options were also discussed in  detail with the patient. Opportunity provided to ask questions and answers provided to her apparent satisfaction. Provided instructions to call our clinic with any problems, questions or concerns prior to return visit. I recommended to continue follow-up with PCP and sub-specialists. She verbalized understanding and agreed with the plan. No barriers to learning was detected.  Arlo Berber, MD  06/16/2023 4:01 PM  [facility] - A DEPT OF [facility] [address] [place] [address] Dept: [phone] Dept Fax: [phone]   I spent a total of 55 minutes during this encounter with the patient including review of chart and various tests results, discussions about plan of care and coordination of care plan.  HISTORY OF PRESENTING ILLNESS:   Discussed the use of AI scribe software for clinical note transcription with the patient, who gave verbal consent to proceed.  History of Present Illness Gabriela Matthews is a 23 year old female who presents with high platelet count. She is accompanied by her mother, Safira Proffit. She was referred by Chyrel Craw, NP for evaluation of high platelet count.  She has experienced elevated platelet counts since at least October of the previous year, with values recorded at 424,000 in October, 450,000 in March, and 499,000 at her most recent primary care visit on 05/03/2023.  She had repeat labs at an urgent care facility on 05/13/2023 and platelet count was 480,000 at the time. Normal platelet counts range from 150,000 to 400,000, with some labs using a cutoff of 450,000. No recent infections, inflammation, or medication changes could account for the elevated platelet count. There is no history of spleen issues or clotting problems.  She has a history of iron deficiency and was on  iron supplements last year, which initially caused nausea but eventually were well-tolerated without constipation. She does  not have heavy menstrual cycles, which are a common cause of iron deficiency, but it is noted that low iron can still occur with regular cycles. Her red and white blood cell counts have remained normal, and she has not been diagnosed with anemia.  She has ongoing abdominal pain that has persisted since undergoing laparoscopic surgery for an ovarian cyst removal on March 28th. The pain has not resolved post-surgery. No sensation of early satiety or pain on the left side of the abdomen. She is under the care of a gynecologist and is not aware of any additional cysts.  No family history of blood disorders. No trouble swallowing or lymph node swelling.  She denies recent infection.   MEDICAL HISTORY:  Past Medical History:  Diagnosis Date   ADHD (attention deficit hyperactivity disorder)    Anxiety    Asthma    Depression    OCD (obsessive compulsive disorder)    ODD (oppositional defiant disorder)    Trichotillomania     SURGICAL HISTORY: Past Surgical History:  Procedure Laterality Date   INNER EAR SURGERY     TONSILLECTOMY      SOCIAL HISTORY: She reports that she has never smoked. She has never used smokeless tobacco. She reports that she does not currently use alcohol. She reports current drug use. Drug: Marijuana.  Social History   Socioeconomic History   Marital status: Single    Spouse name: Not on file   Number of children: Not on file   Years of education: Not on file   Highest education level: Not on file  Occupational History   Not on file  Tobacco Use   Smoking status: Never   Smokeless tobacco: Never  Vaping Use   Vaping status: Never Used  Substance and Sexual Activity   Alcohol use: Not Currently   Drug use: Yes    Types: Marijuana   Sexual activity: Not Currently  Other Topics Concern   Not on file  Social History Narrative   Not on file   Social Drivers of Health   Financial Resource Strain: Not on file  Food Insecurity: No Food Insecurity  (06/16/2023)   Hunger Vital Sign    Worried About Running Out of Food in the Last Year: Never true    Ran Out of Food in the Last Year: Never true  Transportation Needs: No Transportation Needs (06/16/2023)   PRAPARE - Administrator, Civil Service (Medical): No    Lack of Transportation (Non-Medical): No  Physical Activity: Not on file  Stress: Not on file  Social Connections: Not on file  Intimate Partner Violence: Not At Risk (06/16/2023)   Humiliation, Afraid, Rape, and Kick questionnaire    Fear of Current or Ex-Partner: No    Emotionally Abused: No    Physically Abused: No    Sexually Abused: No    FAMILY HISTORY: Family History  Problem Relation Age of Onset   Thyroid  disease Mother    Diabetes Mother    Hypertension Mother    Hypertension Father     ALLERGIES:  She is allergic to gabapentin  and other.  MEDICATIONS:  Current Outpatient Medications  Medication Sig Dispense Refill   amphetamine -dextroamphetamine  (ADDERALL  XR) 20 MG 24 hr capsule Take 20 mg by mouth every morning.     ibuprofen  (ADVIL ) 600 MG tablet Take 1 tablet (600 mg total) by mouth every 6 (six)  hours as needed. 30 tablet 0   ondansetron  (ZOFRAN -ODT) 4 MG disintegrating tablet Take 1 tablet (4 mg total) by mouth every 8 (eight) hours as needed. 20 tablet 0   Oxcarbazepine (TRILEPTAL) 300 MG tablet Take 450-1,050 mg by mouth 2 (two) times daily. 450 mg in the am and 1050 mg at bedtime  1   oxcarbazepine (TRILEPTAL) 600 MG tablet Take 600 mg by mouth 2 (two) times daily. 600mg  in am and 900mg  at HS     EPINEPHrine  0.3 mg/0.3 mL IJ SOAJ injection Inject 0.3 mg into the muscle as needed for anaphylaxis. (Patient not taking: Reported on 06/16/2023) 2 each 0   No current facility-administered medications for this visit.    REVIEW OF SYSTEMS:    Review of Systems  Cardiovascular:  Positive for palpitations.  Gastrointestinal:  Positive for abdominal pain and nausea.  Neurological:  Positive for  dizziness.    All other pertinent systems were reviewed with the patient and are negative.  PHYSICAL EXAMINATION:    Onc Performance Status - 06/16/23 1014       ECOG Perf Status   ECOG Perf Status Restricted in physically strenuous activity but ambulatory and able to carry out work of a light or sedentary nature, e.g., light house work, office work      KPS SCALE   KPS % SCORE Normal activity with effort, some s/s of disease              Vitals:   06/16/23 0944  BP: (!) 133/90  Pulse: (!) 106  Resp: 18  Temp: 98.7 F (37.1 C)  SpO2: 98%   Filed Weights   06/16/23 0944  Weight: 165 lb 6.4 oz (75 kg)    Physical Exam Constitutional:      General: She is not in acute distress.    Appearance: Normal appearance. She is well-developed.  HENT:     Head: Normocephalic and atraumatic.     Mouth/Throat:     Mouth: Mucous membranes are moist.  Cardiovascular:     Rate and Rhythm: Normal rate and regular rhythm.     Heart sounds: Normal heart sounds.  Pulmonary:     Effort: Pulmonary effort is normal. No respiratory distress.     Breath sounds: Normal breath sounds.  Abdominal:     Palpations: Abdomen is soft. There is no mass.     Tenderness: There is no abdominal tenderness.  Musculoskeletal:     Right lower leg: No edema.     Left lower leg: No edema.  Lymphadenopathy:     Cervical: No cervical adenopathy.  Neurological:     General: No focal deficit present.     Mental Status: She is alert and oriented to person, place, and time.  Psychiatric:        Mood and Affect: Mood normal.        Behavior: Behavior normal.     LABORATORY DATA:   I have reviewed the data as listed  Results for orders placed or performed in visit on 06/16/23  Ferritin  Result Value Ref Range   Ferritin 41 11 - 307 ng/mL  Iron and Iron Binding Capacity ([facility] only)  Result Value Ref Range   Iron 133 28 - 170 ug/dL   TIBC 440 347 - 425 ug/dL   Saturation Ratios 31 10.4 -  31.8 %   UIBC 301 148 - 442 ug/dL  Sedimentation rate  Result Value Ref Range   Sed Rate 0 0 -  22 mm/hr  C-reactive protein  Result Value Ref Range   CRP <0.5 <1.0 mg/dL  Lactate dehydrogenase  Result Value Ref Range   LDH 129 98 - 192 U/L  CMP (Cancer Center only)  Result Value Ref Range   Sodium 130 (L) 135 - 145 mmol/L   Potassium 4.2 3.5 - 5.1 mmol/L   Chloride 98 98 - 111 mmol/L   CO2 26 22 - 32 mmol/L   Glucose, Bld 97 70 - 99 mg/dL   BUN 9 6 - 20 mg/dL   Creatinine 0.98 1.19 - 1.00 mg/dL   Calcium 9.5 8.9 - 14.7 mg/dL   Total Protein 7.7 6.5 - 8.1 g/dL   Albumin 4.8 3.5 - 5.0 g/dL   AST 14 (L) 15 - 41 U/L   ALT 10 0 - 44 U/L   Alkaline Phosphatase 60 38 - 126 U/L   Total Bilirubin 0.4 0.0 - 1.2 mg/dL   GFR, Estimated >82 >95 mL/min   Anion gap 6 5 - 15  CBC with Differential (Cancer Center Only)  Result Value Ref Range   WBC Count 5.8 4.0 - 10.5 K/uL   RBC 4.70 3.87 - 5.11 MIL/uL   Hemoglobin 13.6 12.0 - 15.0 g/dL   HCT 62.1 30.8 - 65.7 %   MCV 83.6 80.0 - 100.0 fL   MCH 28.9 26.0 - 34.0 pg   MCHC 34.6 30.0 - 36.0 g/dL   RDW 84.6 96.2 - 95.2 %   Platelet Count 470 (H) 150 - 400 K/uL   nRBC 0.0 0.0 - 0.2 %   Neutrophils Relative % 55 %   Neutro Abs 3.2 1.7 - 7.7 K/uL   Lymphocytes Relative 32 %   Lymphs Abs 1.9 0.7 - 4.0 K/uL   Monocytes Relative 11 %   Monocytes Absolute 0.6 0.1 - 1.0 K/uL   Eosinophils Relative 1 %   Eosinophils Absolute 0.1 0.0 - 0.5 K/uL   Basophils Relative 1 %   Basophils Absolute 0.0 0.0 - 0.1 K/uL   Immature Granulocytes 0 %   Abs Immature Granulocytes 0.00 0.00 - 0.07 K/uL     RADIOGRAPHIC STUDIES:  No recent pertinent imaging available to review.  Orders Placed This Encounter  Procedures   CBC with Differential (Cancer Center Only)    Standing Status:   Future    Number of Occurrences:   1    Expiration Date:   06/15/2024   CMP (Cancer Center only)    Standing Status:   Future    Number of Occurrences:   1     Expiration Date:   06/15/2024   Lactate dehydrogenase    Standing Status:   Future    Number of Occurrences:   1    Expiration Date:   06/15/2024   C-reactive protein    Standing Status:   Future    Number of Occurrences:   1    Expiration Date:   06/15/2024   Sedimentation rate    Standing Status:   Future    Number of Occurrences:   1    Expiration Date:   06/15/2024   Iron and Iron Binding Capacity ([facility] only)    Standing Status:   Future    Number of Occurrences:   1    Expiration Date:   06/15/2024   Ferritin    Standing Status:   Future    Number of Occurrences:   1    Expiration Date:   06/15/2024   JAK2  V617F rfx CALR/MPL/E12-15    Standing Status:   Future    Number of Occurrences:   1    Expiration Date:   06/15/2024    Future Appointments  Date Time Provider Department Center  07/07/2023 10:45 AM Laddie Math, Gale Jude, MD [facility] None  07/20/2023 11:00 AM Penelope Bowie, PT [facility] None  08/11/2023 12:30 PM Price, Glade Lambert C, PT [facility] None  09/23/2023 10:00 AM [facility] None  09/23/2023 10:30 AM Khara Renaud, Gale Jude, MD [facility] None     This document was completed utilizing speech recognition software. Grammatical errors, random word insertions, pronoun errors, and incomplete sentences are an occasional consequence of this system due to software limitations, ambient noise, and hardware issues. Any formal questions or concerns about the content, text or information contained within the body of this dictation should be directly addressed to the provider for clarification.

## 2023-06-23 LAB — CALR +MPL + E12-E15  (REFLEX)

## 2023-06-23 LAB — JAK2 V617F RFX CALR/MPL/E12-15

## 2023-07-05 ENCOUNTER — Telehealth: Payer: Self-pay | Admitting: Oncology

## 2023-07-05 NOTE — Telephone Encounter (Signed)
 Rescheduled appointment per patients request per incoming call. Talked with the patient and she is aware of the changes made to her upcoming appointment.

## 2023-07-07 ENCOUNTER — Telehealth: Admitting: Oncology

## 2023-07-15 ENCOUNTER — Inpatient Hospital Stay: Attending: Oncology | Admitting: Oncology

## 2023-07-15 ENCOUNTER — Encounter: Payer: Self-pay | Admitting: Oncology

## 2023-07-15 DIAGNOSIS — D75839 Thrombocytosis, unspecified: Secondary | ICD-10-CM | POA: Diagnosis not present

## 2023-07-15 NOTE — Assessment & Plan Note (Addendum)
 Chronic, mild thrombocytosis with platelet counts ranging from 424,000 to 499,000 since October last year.   Differential diagnosis includes iron deficiency, infection, inflammation, medication effects, or bone marrow mutation.   No anemia, infection, inflammation, or splenic surgery. Previous iron supplementation caused mild nausea initially but was otherwise tolerated.  On her consultation with us  on 06/16/2023, labs showed stable platelet count of 470,000.  White count, hemoglobin, MCV were all within normal limits.  CMP grossly unremarkable.  Iron studies showed no evidence of iron deficiency.  ESR, CRP, LDH were all within normal limits. JAK2 V617F mutation analysis, followed by reflex testing to include CALR, MPL, exon 12-15 mutations were all negative.  No evidence of primary myeloproliferative neoplasm.   Possible medication-induced thrombocytosis due to Adderall  or Trileptal, both with less than 1% chance of causing blood count issues. Condition is borderline and not concerning. - Continue Adderall  and Trileptal as thrombocytosis is not severe. - Encourage hydration, possibly with electrolyte-based drinks like Gatorade. - Monitor platelet count; consider further workup if it rises. - Follow up in September as scheduled.

## 2023-07-15 NOTE — Progress Notes (Signed)
 [facility]  HEMATOLOGY-ONCOLOGY ELECTRONIC VISIT PROGRESS NOTE  PATIENT NAME: Gabriela Matthews   MR#: [id number] DOB: January 10, 2001  DATE OF SERVICE: 07/15/2023  Patient Care Team: Cristopher Bottcher, NP as PCP - General (Family Medicine)  I connected with the patient via telephone conference and verified that I am speaking with the correct person using two identifiers. The patient's location is at home and I am providing care from the [facility].  I discussed the limitations, risks, security and privacy concerns of performing an evaluation and management service by e-visits and the availability of in person appointments. I also discussed with the patient that there may be a patient responsible charge related to this service. The patient expressed understanding and agreed to proceed.   ASSESSMENT & PLAN:   Gabriela Matthews is a 23 y.o. lady with a past medical history of ADHD, anxiety, depression, OCD, trichotillomania, was referred to our service for evaluation of thrombocytosis.  JAK2 testing followed by reflex testing to include CALR, MPL, exon 12-15 mutations were all negative.  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   Differential diagnosis includes iron deficiency, infection, inflammation, medication effects, or bone marrow mutation.   No anemia, infection, inflammation, or splenic surgery. Previous iron supplementation caused mild nausea initially but was otherwise tolerated.  On her consultation with us  on 06/16/2023, labs showed stable platelet count of 470,000.  White count, hemoglobin, MCV were all within normal limits.  CMP grossly unremarkable.  Iron studies showed no evidence of iron deficiency.  ESR, CRP, LDH were all within normal limits. JAK2 V617F mutation analysis, followed by reflex testing to include CALR, MPL, exon 12-15 mutations were all  negative.  No evidence of primary myeloproliferative neoplasm.   Possible medication-induced thrombocytosis due to Adderall  or Trileptal, both with less than 1% chance of causing blood count issues. Condition is borderline and not concerning. - Continue Adderall  and Trileptal as thrombocytosis is not severe. - Encourage hydration, possibly with electrolyte-based drinks like Gatorade. - Monitor platelet count; consider further workup if it rises. - Follow up in September as scheduled.   I discussed the assessment and treatment plan with the patient. The patient was provided an opportunity to ask questions and all were answered. The patient agreed with the plan and demonstrated an understanding of the instructions. The patient was advised to call back or seek an in-person evaluation if the symptoms worsen or if the condition fails to improve as anticipated.    I spent 11 minutes over the phone with the patient reviewing test results, discuss management and coordination/planning of care.  Gabriela Patten, MD 07/15/2023 2:52 PM  [facility] - A DEPT OF [facility] [address] [place] [address] Dept: [phone] Dept Fax: [phone]   INTERVAL HISTORY:  Please see above for problem oriented charting.  The purpose of today's discussion is to explain recent lab results and to formulate plan of care.  Discussed the use of AI scribe software for clinical note transcription with the patient, who gave verbal consent to proceed.  History of Present Illness Gabriela Matthews is a 23 year old female who presents with elevated platelet count.  She is being monitored for thrombocytosis, with a platelet count of 470,000 noted during her last visit. Her white blood cell count and red blood cell count are within normal limits. Iron levels  and inflammatory markers are also normal. Genetic testing for bone marrow mutations returned  negative, ruling out common genetic causes of thrombocytosis.  She is currently taking Adderall  and Trileptal, both of which have a rare side effect of causing blood count issues, occurring in less than 1% of cases.  She attempts to maintain hydration by drinking one to two bottles of water daily, although she finds water unpalatable due to past experiences. She is open to trying electrolyte-based drinks like Gatorade to improve her hydration status.      SUMMARY OF HEMATOLOGY HISTORY:  Oncology History   No history exists.    REVIEW OF SYSTEMS:    Review of Systems - Oncology  All other pertinent systems were reviewed with the patient and are negative.  I have reviewed the past medical history, past surgical history, social history and family history with the patient and they are unchanged from previous note.  ALLERGIES:  She is allergic to gabapentin  and other.  MEDICATIONS:  Current Outpatient Medications  Medication Sig Dispense Refill   amphetamine -dextroamphetamine  (ADDERALL  XR) 20 MG 24 hr capsule Take 20 mg by mouth every morning.     EPINEPHrine  0.3 mg/0.3 mL IJ SOAJ injection Inject 0.3 mg into the muscle as needed for anaphylaxis. (Patient not taking: Reported on 06/16/2023) 2 each 0   ibuprofen  (ADVIL ) 600 MG tablet Take 1 tablet (600 mg total) by mouth every 6 (six) hours as needed. 30 tablet 0   ondansetron  (ZOFRAN -ODT) 4 MG disintegrating tablet Take 1 tablet (4 mg total) by mouth every 8 (eight) hours as needed. 20 tablet 0   Oxcarbazepine (TRILEPTAL) 300 MG tablet Take 450-1,050 mg by mouth 2 (two) times daily. 450 mg in the am and 1050 mg at bedtime  1   oxcarbazepine (TRILEPTAL) 600 MG tablet Take 600 mg by mouth 2 (two) times daily. 600mg  in am and 900mg  at HS     No current facility-administered medications for this visit.    PHYSICAL EXAMINATION:    Onc Performance Status - 07/15/23 1400       ECOG Perf Status   ECOG Perf Status Restricted in  physically strenuous activity but ambulatory and able to carry out work of a light or sedentary nature, e.g., light house work, office work      KPS SCALE   KPS % SCORE Normal activity with effort, some s/s of disease          LABORATORY DATA:   I have reviewed the data as listed.  Recent Results (from the past 2160 hours)  JAK2 V617F rfx CALR/MPL/E12-15     Status: None   Collection Time: 06/16/23 10:46 AM  Result Value Ref Range   Specimen Type Comment:     Comment: NOT PROVIDED   JAK2 V617F Result Comment     Comment: (NOTE) NEGATIVE The JAK2 V617F mutation is not detected in the provided specimen of this individual. Results should be interpreted in conjunction with clinical and other laboratory findings for the most accurate interpretation. This test was developed and its performance characteristics determined by [facility]. It has not been cleared or approved by the Food and Drug Administration.    Reflex Comment     Comment: (NOTE) Reflex to CALR Mutation Analysis, JAK2 Exon 12-15 Mutation Analysis, and MPL Mutation Analysis is indicated.    V617F Rfx CALR/MPL/E12-15 Bkgd Comment     Comment: (NOTE)    Molecular testing of blood or bone marrow is useful in the evaluation of suspected  myeloproliferative neoplasms (MPN). Mutations in the JAK2, MPL, and CALR genes are present in virtually all MPNs and their presence help distinguish benign reactive processes from clonal neoplasms. These mutations are generally considered mutually exclusive, although concurrent clones have been reported in rare patients. This test will assess for the JAK2V617F (exon 14) mutation first and will reflex to CALR mutation analysis, MPL mutation analysis, and JAK2 exon 12 to 15 mutation analysis if the JAK2V617F mutation is negative.    The JAK2 (Janus kinase 2) gene encodes for a non-receptor protein tyrosine kinase that activates cytokine and growth factor signaling. The V617F (c.1849 G>T)  mutation results in constitutive activation of JAK2 and downstream STAT5 and ERK signaling. The V617F mutation is observed in approximately 95% of polycythemia vera (PV), 60% of essential thromboc ythemia (ET) and primary myelofibrosis (PMF). It is also infrequently present (3-5%) in myelodysplastic syndrome, chronic myelomonocytic leukemia, and other atypical chronic myeloid disorders. A small percentage of JAK2 mutation positive patients (3.3%) contain other non-V617F mutations within exons 12 to 15. In particular, mutations in exon 12 of JAK2 have been described in approximately 3% of patients with PV. JAK2 allele burden correlates with clinical phenotype, with low levels of mutant allele characterized by thrombocytosis, intermediate levels with erythrocytosis, and high mutant allele burden correlating with enhanced myelopoiesis of the BM, leukocytosis, increasing spleen size, and circulating CD34-positive cells.    The CALR (Calreticulin) gene encodes for a multifunctional calcium-binding protein involved in many cellular activities such as growth, proliferation, adhesion, and programmed cell death. Among patients with JAK2 negative MPNs, CALR are found in a pproximately 70% of patients with JAK2-negative essential thrombocythemia (ET) and 60-88% of patients with JAK2-negative primary myelofibrosis(PMF). Only a minority of patients (approximately 8%) with myelodysplasia have mutations in the CALR gene. CALR mutations are rarely detected in patients with de novo acute myeloid leukemia, chronic myelogenous leukemia, lymphoid leukemia, or solid tumors. CALR mutations are not detected in polycythemia and generally appear to be mutually exclusive with JAK2 mutations and MPL mutations. The majority of mutational changes involve a variety of insertion deletion mutations in exon 9 of the calreticulin gene: approximately 53% of all CALR mutations are a 52 bp deletion (type-1) while the  second most prevalent mutation (approximately 32%) contains a 5 bp insertion (type-2). Other mutations (non-type 1 or type 2) are seen in a small minority of cases. CALR mutations in PMF tend to be with a favorable prognosis compared to JAK2 V617F [employer], whereas primary myelofibrosis negative for CALR, JAK2 V617F and MPL mutations (so-called triple negative) is associated with a poor prognosis and shorter survival.    The MPL (myeloproliferative leukemia virus oncogene) gene encodes the thrombopoietin receptor which regulates hematopoiesis and megakaryopoiesis. Activating MPL mutations are associated with a subset of myeloproliferative neoplasms and acute megakaryoblastic leukemia. MPL W515 mutations are present in approximately 5-8% of patients with primary myelofibrosis (PMF) and 1-4% of patients with essential thrombocythemia (ET). The S505 mutation is detected in patients with hereditary thrombocythemia.    Limitations    This assay has a sensitivity of approximately 1% VAF for JAK2 V617F, 2.5% VAF for other mutations in JAK2 exons 12 to 15, CALR mutations, and MPL mutations. Deletions in JAK2 up to 6 bp and insertions up to 34 bp have been detected in validation studies. Deletions in CALR up to 70 bp and i nsertions up to 12 bp have been detected in validation studies.    Method based next generation sequencing.  Comment: Comment [employer]    References Comment     Comment: (NOTE) Alghasham N, Alnouri Y, Abalkhail H, Collier RAMAN. Detection of mutations in JAK2 exons 12-15 by [employer] sequencing. Int J Lab Hematol. 2016 Feb;38(1):34-41. doi: 10.1111/ijlh.87574. Epub 2015 Sep 11. PMID: 73638915. Glorine ISLE, Cheryll LABOR, Hasserjian R, Omega PARAS, Borowitz MJ, Ladora Campanile MM, [place] CD, Cazzola M, Vardiman JW. The 2016 revision to the World Health Organization classification of myeloid neoplasms and acute leukemia. Blood. 2016 May 19;127(20):2391-405.  doi: 10.1182/blood-2016-03-643544. Epub 2016 Apr 11. PMID: 72930745. Honor LELON Glennie VEAR Laurita OLEGARIO Halford Fairbanks, Zhang ZJ, Oreland S, Albitar M. Mutation profile of JAK2 transcripts in patients with chronic myeloproliferative neoplasias. J Mol Diagn. 2009 Jan;11(1):49-53.doi: 10.2353/jmoldx.2009.080114. Epub 2008 Dec 12. PMID: 80925404; PMCID: EFR7392434. NCCN Clinical Practice Guidelines in Oncology (NCCN Guidelines) Myeloproliferative Neoplasms Version 3.2022 - August 22, 2020. Swerdlow SH, Programmer, multimedia. WHO classif ication of Tumours of Haematopoietic and Lymphoid Tissues. 4th edn. Epifanio, [place]: Geologist, engineering for [employer] on [employer]; 2017. Tefferi A. Primary myelofibrosis: 2021 update on diagnosis, risk-stratification and management. Am J Hematol. 2021 Jan;96(1):145-162. doi: 10.1002/ajh.26050. Epub 2020 Dec 2. PMID: 66802950. Vainchenker W, Kralovics R. Genetic basis and molecular pathophysiology of classical myeloproliferative neoplasms. Blood. 2017 Feb 9;129(6):667-679. doi: 10.1182/blood-2016-10-695940. Epub 2016 Dec 27. PMID: 71971970.    Director Review Comment     Comment: (NOTE) Lucillie Paddock, PhD, [facility] Director, Molecular Oncology [facility] [address] [place], [place] [address] [phone] Performed At: [facility] [address] [place], [place] [id number] Loran Gales MDPhD Ey:[id number] Performed At: [facility] [address] [place], [place] [id number] Loran Gales MDPhD Ey:[id number]   Ferritin     Status: None   Collection Time: 06/16/23 10:46 AM  Result Value Ref Range   Ferritin 41 11 - 307 ng/mL    Comment: Performed at [facility], [address], [place], [place] [address]  Iron and Iron Binding Capacity ([facility] only)     Status: None   Collection Time: 06/16/23 10:46 AM  Result Value Ref Range   Iron 133 28 - 170 ug/dL   TIBC 565 749 - 549 ug/dL   Saturation Ratios 31 10.4 - 31.8 %    UIBC 301 148 - 442 ug/dL    Comment: Performed at [facility], [address], [place], [place] [address]  Sedimentation rate     Status: None   Collection Time: 06/16/23 10:46 AM  Result Value Ref Range   Sed Rate 0 0 - 22 mm/hr    Comment: Performed at [facility], [address], [place], [place] [address]  C-reactive protein     Status: None   Collection Time: 06/16/23 10:46 AM  Result Value Ref Range   CRP <0.5 <1.0 mg/dL    Comment: Performed at [facility], [address] [place], [place] [address]  CMP (Cancer Center only)     Status: Abnormal   Collection Time: 06/16/23 10:46 AM  Result Value Ref Range   Sodium 130 (L) 135 - 145 mmol/L   Potassium 4.2 3.5 - 5.1 mmol/L   Chloride 98 98 - 111 mmol/L   CO2 26 22 - 32 mmol/L   Glucose, Bld 97 70 - 99 mg/dL    Comment: Glucose reference range applies only to samples taken after fasting for at least 8 hours.   BUN 9 6 - 20 mg/dL   Creatinine 9.35 9.55 - 1.00  mg/dL   Calcium 9.5 8.9 - 89.6 mg/dL   Total Protein 7.7 6.5 - 8.1 g/dL   Albumin 4.8 3.5 - 5.0 g/dL   AST 14 (L) 15 - 41 U/L   ALT 10 0 - 44 U/L   Alkaline Phosphatase 60 38 - 126 U/L   Total Bilirubin 0.4 0.0 - 1.2 mg/dL   GFR, Estimated >39 >39 mL/min    Comment: (NOTE) Calculated using the CKD-EPI Creatinine Equation (2021)    Anion gap 6 5 - 15    Comment: Performed at [facility], [address] [place], [place] [address]  CBC with Differential (Cancer Center Only)     Status: Abnormal   Collection Time: 06/16/23 10:46 AM  Result Value Ref Range   WBC Count 5.8 4.0 - 10.5 K/uL   RBC 4.70 3.87 - 5.11 MIL/uL   Hemoglobin 13.6 12.0 - 15.0 g/dL   HCT 60.6 63.9 - 53.9 %   MCV 83.6 80.0 - 100.0 fL   MCH 28.9 26.0 - 34.0 pg   MCHC 34.6 30.0 - 36.0 g/dL   RDW 87.3 88.4 - 84.4 %   Platelet Count 470 (H) 150 - 400 K/uL   nRBC 0.0 0.0 - 0.2 %   Neutrophils Relative % 55 %   Neutro Abs  3.2 1.7 - 7.7 K/uL   Lymphocytes Relative 32 %   Lymphs Abs 1.9 0.7 - 4.0 K/uL   Monocytes Relative 11 %   Monocytes Absolute 0.6 0.1 - 1.0 K/uL   Eosinophils Relative 1 %   Eosinophils Absolute 0.1 0.0 - 0.5 K/uL   Basophils Relative 1 %   Basophils Absolute 0.0 0.0 - 0.1 K/uL   Immature Granulocytes 0 %   Abs Immature Granulocytes 0.00 0.00 - 0.07 K/uL    Comment: Performed at [facility], [address] [place], [place] [address]  CALR +MPL + E12-E15 (reflexed)     Status: None   Collection Time: 06/16/23 10:46 AM  Result Value Ref Range   CALR Result Comment     Comment: (NOTE) NEGATIVE No insertions or deletions were detected within the analyzed region of the calreticulin (CALR) gene. A negative result does not entirely exclude the possibility of a clonal population carrying CALR gene mutations that are not covered by this assay. Results should be interpreted in conjunction with clinical and laboratory findings for the most accurate interpretation.    MPL Result Comment     Comment: (NOTE) NEGATIVE No MPL mutation was identified in the provided specimen of this individual. Results should be interpreted in conjunction with clinical and other laboratory findings for the most accurate interpretation.    E12-15 Result Comment     Comment: (NOTE) NEGATIVE    JAK2 mutations were not detected in exons 12, 13, 14 and 15. The G to T nucleotide change encoding the V617F mutation was not detected. This result does not rule out the presence of JAK2 mutation at a level below the detection sensitivity of this assay, the presence of other mutations outside the analyzed region of the JAK2 gene, or the presence of a myeloproliferative or other neoplasm. Result must be correlated with other clinical data for the most accurate diagnosis. Performed At: [facility] [address] [place], [place] [id number] Loran Gales MDPhD Ey:[phone]   Lactate  dehydrogenase     Status: None   Collection Time: 06/16/23 10:47 AM  Result Value Ref Range   LDH  129 98 - 192 U/L    Comment: Performed at [facility], [address] [place], [place] [address]     RADIOGRAPHIC STUDIES:  No recent pertinent imaging studies available to review.  No orders of the defined types were placed in this encounter.    Future Appointments  Date Time Provider Department Center  07/20/2023 11:00 AM Janit Paris, PT [facility] None  08/11/2023 12:30 PM Price, Celena C, PT [facility] None  09/23/2023 10:00 AM [facility] None  09/23/2023 10:30 AM Bayle Calvo, Chinita, MD [facility] None    This document was completed utilizing speech recognition software. Grammatical errors, random word insertions, pronoun errors, and incomplete sentences are an occasional consequence of this system due to software limitations, ambient noise, and hardware issues. Any formal questions or concerns about the content, text or information contained within the body of this dictation should be directly addressed to the provider for clarification.

## 2023-07-19 NOTE — Therapy (Signed)
 OUTPATIENT PHYSICAL THERAPY CERVICAL EVALUATION   Patient Name: Gabriela Matthews MRN: [id number] DOB:2000/04/13, 23 y.o., female Today's Date: 07/20/2023  END OF SESSION:  PT End of Session - 07/20/23 1344     Visit Number 1    Date for PT Re-Evaluation 09/14/23    Authorization Type [employer] (auth requested)    PT Start Time 1105    PT Stop Time 1135    PT Time Calculation (min) 30 min    Activity Tolerance Patient tolerated treatment well    Behavior During Therapy WFL for tasks assessed/performed          Past Medical History:  Diagnosis Date   ADHD (attention deficit hyperactivity disorder)    Anxiety    Asthma    Depression    OCD (obsessive compulsive disorder)    ODD (oppositional defiant disorder)    Trichotillomania    Past Surgical History:  Procedure Laterality Date   INNER EAR SURGERY     TONSILLECTOMY     Patient Active Problem List   Diagnosis Date Noted   Thrombocytosis 06/16/2023    PCP: Cristopher Bottcher, NP   REFERRING PROVIDER: Dasie Fitch, MD  REFERRING DIAG: M62.89 (ICD-10-CM) - Other specified disorders of muscle M54.2 (ICD-10-CM) - Cervicalgia  THERAPY DIAG:  Cervicalgia  Abnormal posture  Muscle weakness (generalized)  Rationale for Evaluation and Treatment: Rehabilitation  ONSET DATE: 4-5 years ago  SUBJECTIVE:                                                                                                                                                                                                         SUBJECTIVE STATEMENT: Patient presents with neck pain that started 4-5 years ago. Pain started randomly and it has been getting worse over the years. She takes pain killers every single day. Neck pain can be bilateral but currently it is just on the right side. This is the normal side it usually hurts on. Neck pain causes migraines sometimes it is infrequent but the weather also impacts it. Patient denies numbness and  tingling in hands. Neck pain does not interfere with sleep. Hand dominance: Right  PERTINENT HISTORY:  ADHD; Anxiety; Asthma; Depression   PAIN:  Are you having pain? Yes: NPRS scale: 5-6(currently) 10(worst)/10 Pain location: bilateral upper trap area & shoulders Pain description: dull, achy, sharp, intense Aggravating factors: not having a comfortable pillow; sitting in a car for a long period of time with neck unsupported; bending over with head down Relieving factors: falling asleep  PRECAUTIONS: None  RED FLAGS: None  WEIGHT BEARING RESTRICTIONS: No  FALLS:  Has patient fallen in last 6 months? 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  DIAGNOSTIC FINDINGS:  None  PATIENT SURVEYS:  NDI: 16/50 32%  COGNITION: Overall cognitive status: Within functional limits for tasks assessed  SENSATION: WFL  POSTURE: rounded shoulders and forward head  PALPATION: Tenderness with palpation Rt upper trap Tenderness and increased muscle spasms right thoracic paraspinals   Good joint mobility in cervical and thoracic spine Pain with C PA on left side (shooting pain to top of head)  CERVICAL ROM: * pain  Active ROM A/PROM (deg) eval  Flexion 40*  Extension 30  Right lateral flexion 30*  Left lateral flexion 40  Right rotation 90*  Left rotation 90*   (Blank rows = not tested)  UPPER EXTREMITY ROM: WFL bilateral patient verbalized some left upper trap pain  with all shoulder motion  UPPER EXTREMITY MMT: Grossly 4+/5 more challenging on Rt compared to Lt . Bilateral lower + middle trap 3+/5    FUNCTIONAL TESTS:  5 times sit to stand: To be tested at next section  TREATMENT DATE:  07/20/2023 Initial Evaluation & HEP created                                                                                                                                If treatment provided at initial evaluation, no treatment charged due to lack of authorization.        PATIENT EDUCATION:  Education details: PT eval findings, anticipated POC, progress with PT, and initial HEP Person educated: Patient Education method: Explanation, Demonstration, and Handouts Education comprehension: verbalized understanding, returned demonstration, and needs further education  HOME EXERCISE PROGRAM: Access Code: [email or web address] URL: [email or web address] Date: 07/20/2023 Prepared by: Kristeen Sar  Exercises - Standing Shoulder Row with Anchored Resistance  - 1 x daily - 7 x weekly - 1-2 sets - 10 reps - Shoulder extension with resistance - Neutral  - 1 x daily - 7 x weekly - 1-2 sets - 10 reps - Cat Cow  - 1 x daily - 7 x weekly - 1 sets - 10 reps - Sidelying Thoracic Rotation with Open Book  - 1 x daily - 7 x weekly - 1 sets - 10 reps  ASSESSMENT:  CLINICAL IMPRESSION: Patient is a 23 y.o. female who was seen today for physical therapy evaluation and treatment for neck pain. [name] presents with neck pain that began 4-5 years ago. She neck pain is mainly on the  right side but it can be on the left side as well. Patient works at a [employer] and she is limited in her lifting ability due to pain. Any weight above 20 lbs causes her increased pain. Based on evaluation noted poor posture, increased muscle spasms, and muscle weakness. Educated patient on the plan for therapy and what we will focus on. Patient is motivated and wants to get better.  OBJECTIVE  IMPAIRMENTS: decreased ROM, decreased strength, increased muscle spasms, impaired flexibility, impaired tone, postural dysfunction, and pain.   ACTIVITY LIMITATIONS: lifting, bending, sitting, and reach over head  PARTICIPATION LIMITATIONS: interpersonal relationship, community activity, and occupation  PERSONAL FACTORS: Fitness, Time since onset of injury/illness/exacerbation, and 3+ comorbidities: ADHD; Anxiety; Asthma; Depression  are also affecting patient's functional outcome.   REHAB POTENTIAL: Good  CLINICAL DECISION MAKING: Evolving/moderate complexity  EVALUATION COMPLEXITY: Low   GOALS: Goals reviewed with patient? Yes  SHORT TERM GOALS: Target date: 08/17/2023  Patient will be independent with initial HEP. Baseline:  Goal status: INITIAL  2.  Patient will report > or = to 40% improvement in neck pain  since starting PT. Baseline:  Goal status: INITIAL   3.  Patient will demonstrate correct lifting technique with no verbal cues to prevent injury at work. Baseline:  Goal status: INITIAL   LONG TERM GOALS: Target date: 09/14/2023  Patient will demonstrate independence in advanced HEP. Baseline:  Goal status: INITIAL  2.  Patient will report > or = to 80% improvement in neck pain since starting PT. Baseline:  Goal status: INITIAL  3.  Patient will verbalize and demonstrate self-care strategies to manage pain including tissue mobility practices and change of position. Baseline:  Goal status: INITIAL   4.  Patient will score < or = to 11/50 on NDI due to decreased self perceived disability & improved function Baseline: 16/50 Goal status: INITIAL  5.  Patient will be able to lift > or = to 15-20 lbs with proper form to prevent injury while performing functional tasks. Baseline:  Goal status: INITIAL    PLAN:  PT FREQUENCY: 1-2x/week  PT DURATION: 8 weeks  PLANNED INTERVENTIONS: 97164- PT Re-evaluation, 97110-Therapeutic exercises, 97530- Therapeutic  activity, 97112- Neuromuscular re-education, 97535- Self Care, 02859- Manual therapy, [phone]- Canalith repositioning, [id number]- Aquatic Therapy, [phone]- Electrical stimulation (unattended), [phone]- Electrical stimulation (manual), [id number]- Ultrasound, [id number]- Traction (mechanical), [id number]- Ionotophoresis 4mg /ml Dexamethasone, 79439 (1-2 muscles), 20561 (3+ muscles)- Dry Needling, Patient/Family education, Balance training, Stair training, Taping, Joint mobilization, Joint manipulation, Spinal manipulation, Spinal mobilization, Vestibular training, Cryotherapy, and Moist heat  PLAN FOR NEXT SESSION: Review HEP; UBE; cervical melt method; lower & middle trap strengthening; lifting technique    Kristeen Sar, PT 07/20/23 1:45 PM [facility] [address], [address] [place], [place] [address] Phone # [phone] Fax [phone]

## 2023-07-20 ENCOUNTER — Ambulatory Visit: Attending: Sports Medicine | Admitting: Physical Therapy

## 2023-07-20 ENCOUNTER — Encounter: Payer: Self-pay | Admitting: Physical Therapy

## 2023-07-20 ENCOUNTER — Other Ambulatory Visit: Payer: Self-pay

## 2023-07-20 DIAGNOSIS — M6281 Muscle weakness (generalized): Secondary | ICD-10-CM | POA: Insufficient documentation

## 2023-07-20 DIAGNOSIS — R293 Abnormal posture: Secondary | ICD-10-CM | POA: Diagnosis present

## 2023-07-20 DIAGNOSIS — M542 Cervicalgia: Secondary | ICD-10-CM | POA: Insufficient documentation

## 2023-07-28 ENCOUNTER — Ambulatory Visit: Admitting: Physical Therapy

## 2023-07-28 DIAGNOSIS — R293 Abnormal posture: Secondary | ICD-10-CM

## 2023-07-28 DIAGNOSIS — M6281 Muscle weakness (generalized): Secondary | ICD-10-CM

## 2023-07-28 DIAGNOSIS — M542 Cervicalgia: Secondary | ICD-10-CM | POA: Diagnosis not present

## 2023-07-28 NOTE — Therapy (Signed)
 OUTPATIENT PHYSICAL THERAPY CERVICAL PROGRESS NOTE   Patient Name: Gabriela Matthews MRN: [id number] DOB:[birth year], 23 y.o., female Today's Date: 07/28/2023  END OF SESSION:  PT End of Session - 07/28/23 1059     Visit Number 2    Number of Visits 6    Authorization Type [employer] 6 visits 7/8-9/5    PT Start Time 1100    PT Stop Time 1140    PT Time Calculation (min) 40 min    Activity Tolerance Patient tolerated treatment well          Past Medical History:  Diagnosis Date   ADHD (attention deficit hyperactivity disorder)    Anxiety    Asthma    Depression    OCD (obsessive compulsive disorder)    ODD (oppositional defiant disorder)    Trichotillomania    Past Surgical History:  Procedure Laterality Date   INNER EAR SURGERY     TONSILLECTOMY     Patient Active Problem List   Diagnosis Date Noted   Thrombocytosis 06/16/2023    PCP: Cristopher Bottcher, NP   REFERRING PROVIDER: Dasie Fitch, MD  REFERRING DIAG: M62.89 (ICD-10-CM) - Other specified disorders of muscle M54.2 (ICD-10-CM) - Cervicalgia  THERAPY DIAG:  Cervicalgia  Abnormal posture  Muscle weakness (generalized)  Rationale for Evaluation and Treatment: Rehabilitation  ONSET DATE: 4-5 years ago  SUBJECTIVE:                                                                                                                                                                                                         SUBJECTIVE STATEMENT: More migraines b/c of the neck. Exercises are OK.    Hand dominance: Right  PERTINENT HISTORY:  ADHD; Anxiety; Asthma; Depression   PAIN:  Are you having pain? Yes: NPRS scale: 5 Pain location: bilateral upper trap area & shoulders Pain description: dull, achy, sharp, intense Aggravating factors: not having a comfortable pillow; sitting in a car for a long period of time with neck unsupported; bending over with head down Relieving factors: falling  asleep  PRECAUTIONS: None  RED FLAGS: None     WEIGHT BEARING RESTRICTIONS: No  FALLS:  Has patient fallen in last 6 months? 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  DIAGNOSTIC FINDINGS:  None  PATIENT SURVEYS:  NDI: 16/50 32%  COGNITION: Overall cognitive status: Within functional limits for tasks assessed  SENSATION: WFL  POSTURE: rounded shoulders and forward head  PALPATION: Tenderness with palpation Rt upper trap Tenderness and increased muscle spasms right thoracic paraspinals   Good joint mobility in cervical and thoracic spine Pain with C PA on left side (shooting pain to top of head)  CERVICAL ROM: * pain  Active ROM A/PROM (deg) eval  Flexion 40*  Extension 30  Right lateral flexion 30*  Left lateral flexion 40  Right rotation 90*  Left rotation 90*   (Blank rows = not tested)  UPPER EXTREMITY ROM: WFL bilateral patient verbalized some left upper trap pain with all shoulder motion  UPPER EXTREMITY MMT: Grossly 4+/5 more challenging on Rt compared to Lt . Bilateral lower + middle trap 3+/5    FUNCTIONAL TESTS:  5 times sit to stand: To be tested at next section  TREATMENT DATE:  07/28/2023 UBE 5 min forward only while discussing status Melt method foam roll: rotation, circles, nods 8x each Supine with foam roll vertical length of spine with bil UE elevation 8x with deep breathing (Added to HEP- see below) Quadruped with foam roll:  childs pose and thread the needle 8x each Seated thoracic extension with ball, hands behind neck 10x (Added to HEP- see below) Seated cervical rotation with towel (self mobilization) (Added to HEP- see below) Standing green band bil row 10x anchored on doorknob (Added to HEP- see below) Standing green band bil shoulder extension 10x anchored over the top of the door  (Added to HEP- see below) Pt given green band for home    07/20/2023 Initial Evaluation & HEP created                                                                                                                                If treatment provided at initial evaluation, no treatment charged due to lack of authorization.        PATIENT EDUCATION:  Education details: PT eval findings, anticipated POC, progress with PT, and initial HEP Person educated: Patient Education method: Explanation, Demonstration, and Handouts Education comprehension: verbalized understanding, returned demonstration, and needs further education  HOME EXERCISE PROGRAM: Access Code: [email or web address] URL: [email or web address] Date: 07/28/2023 Prepared by: Glade Pesa  Exercises - Standing Shoulder Row with Anchored Resistance  - 1 x daily - 7 x weekly - 1-2 sets - 10 reps - Shoulder extension with resistance - Neutral  - 1 x daily - 7 x weekly - 1-2 sets - 10 reps - Cat Cow  - 1 x daily - 7 x weekly - 1 sets - 10 reps - Sidelying Thoracic Rotation with Open Book  - 1 x daily - 7 x weekly -  1 sets - 10 reps - Overhead Reach on Foam Roll  - 1 x daily - 7 x weekly - 1 sets - 10 reps - Seated Thoracic Lumbar Extension with Pectoralis Stretch  - 1 x daily - 7 x weekly - 1 sets - 10 reps - Seated Assisted Cervical Rotation with Towel  - 1 x daily - 7 x weekly - 1 sets - 10 reps - Standing Bilateral Low Shoulder Row with Anchored Resistance  - 1 x daily - 7 x weekly - 1 sets - 10 reps - Standing Shoulder Extension with Resistance  - 1 x daily -  7 x weekly - 1 sets - 10 reps  ASSESSMENT:  CLINICAL IMPRESSION: The patient responded well to mobility ex's with emphasis on cervical rotation, thoracic rotation and extension.  Therapist progressing and updating HEP for increased intensity and challenge level for further strengthening and functional mobility.   Therapist monitoring response and providing verbal cues to optimize exercise technique.  She reports some pain reduction at the end of session.     OBJECTIVE IMPAIRMENTS: decreased ROM, decreased strength, increased muscle spasms, impaired flexibility, impaired tone, postural dysfunction, and pain.   ACTIVITY LIMITATIONS: lifting, bending, sitting, and reach over head  PARTICIPATION LIMITATIONS: interpersonal relationship, community activity, and occupation  PERSONAL FACTORS: Fitness, Time since onset of injury/illness/exacerbation, and 3+ comorbidities: ADHD; Anxiety; Asthma; Depression  are also affecting patient's functional outcome.   REHAB POTENTIAL: Good  CLINICAL DECISION MAKING: Evolving/moderate complexity  EVALUATION COMPLEXITY: Low   GOALS: Goals reviewed with patient? Yes  SHORT TERM GOALS: Target date: 08/17/2023  Patient will be independent with initial HEP. Baseline:  Goal status: INITIAL  2.  Patient will report > or = to 40% improvement in neck pain  since starting PT. Baseline:  Goal status: INITIAL   3.  Patient will demonstrate correct lifting technique with no verbal cues to prevent injury at work. Baseline:  Goal status: INITIAL   LONG TERM GOALS: Target date: 09/14/2023  Patient will demonstrate independence in advanced HEP. Baseline:  Goal status: INITIAL  2.  Patient will report > or = to 80% improvement in neck pain since starting PT. Baseline:  Goal status: INITIAL  3.  Patient will verbalize and demonstrate self-care strategies to manage pain including tissue mobility practices and change of position. Baseline:  Goal status:  INITIAL   4.  Patient will score < or = to 11/50 on NDI due to decreased self perceived disability & improved function Baseline: 16/50 Goal status: INITIAL  5.  Patient will be able to lift > or = to 15-20 lbs with proper form to prevent injury while performing functional tasks. Baseline:  Goal status: INITIAL    PLAN:  PT FREQUENCY: 1-2x/week  PT DURATION: 8 weeks  PLANNED INTERVENTIONS: 97164- PT Re-evaluation, 97110-Therapeutic exercises, 97530- Therapeutic activity, 97112- Neuromuscular re-education, 97535- Self Care, 02859- Manual therapy, [phone]- Canalith repositioning, [id number]- Aquatic Therapy, [phone]- Electrical stimulation (unattended), [phone]- Electrical stimulation (manual), [id number]- Ultrasound, [id number]- Traction (mechanical), [id number]- Ionotophoresis 4mg /ml Dexamethasone, 79439 (1-2 muscles), 20561 (3+ muscles)- Dry Needling, Patient/Family education, Balance training, Stair training, Taping, Joint mobilization, Joint manipulation, Spinal manipulation, Spinal mobilization, Vestibular training, Cryotherapy, and Moist heat  PLAN FOR NEXT SESSION: assess response to additions to HEP;  green band rows/ extensions; UBE; cervical melt method; lower & middle trap strengthening; lifting technique   Glade Pesa, PT 07/28/23 5:58 PM Phone: [phone] Fax: [phone]  [facility] [address] [place], [place]  [address] Phone # [phone] Fax [phone]

## 2023-08-05 ENCOUNTER — Telehealth: Payer: Self-pay | Admitting: Physical Therapy

## 2023-08-05 ENCOUNTER — Ambulatory Visit: Admitting: Physical Therapy

## 2023-08-05 NOTE — Telephone Encounter (Signed)
 Spoke with patient about missed appointment today. She got her appointment times mixed up. Patient confirm pelvic evaluation next week and next ortho appointment 8/1.    Kristeen Sar, PT 08/05/23 8:21 AM

## 2023-08-11 ENCOUNTER — Ambulatory Visit: Admitting: Physical Therapy

## 2023-08-13 ENCOUNTER — Ambulatory Visit: Attending: Sports Medicine | Admitting: Physical Therapy

## 2023-08-13 DIAGNOSIS — M542 Cervicalgia: Secondary | ICD-10-CM | POA: Insufficient documentation

## 2023-08-13 DIAGNOSIS — M6281 Muscle weakness (generalized): Secondary | ICD-10-CM | POA: Diagnosis present

## 2023-08-13 DIAGNOSIS — R293 Abnormal posture: Secondary | ICD-10-CM | POA: Insufficient documentation

## 2023-08-13 NOTE — Therapy (Signed)
 OUTPATIENT PHYSICAL THERAPY CERVICAL TREATMENT   Patient Name: Gabriela Matthews MRN: [id number] DOB:06-Jun-2000, 23 y.o., female Today's Date: 08/13/2023  END OF SESSION:  PT End of Session - 08/13/23 1054     Visit Number 3    Number of Visits 6    Authorization Type [employer] 6 visits 7/8-9/5    PT Start Time 1015    PT Stop Time 1100    PT Time Calculation (min) 45 min    Activity Tolerance Patient tolerated treatment well           Past Medical History:  Diagnosis Date   ADHD (attention deficit hyperactivity disorder)    Anxiety    Asthma    Depression    OCD (obsessive compulsive disorder)    ODD (oppositional defiant disorder)    Trichotillomania    Past Surgical History:  Procedure Laterality Date   INNER EAR SURGERY     TONSILLECTOMY     Patient Active Problem List   Diagnosis Date Noted   Thrombocytosis 06/16/2023    PCP: Cristopher Bottcher, NP   REFERRING PROVIDER: Dasie Fitch, MD  REFERRING DIAG: M62.89 (ICD-10-CM) - Other specified disorders of muscle M54.2 (ICD-10-CM) - Cervicalgia  THERAPY DIAG:  Cervicalgia  Abnormal posture  Muscle weakness (generalized)  Rationale for Evaluation and Treatment: Rehabilitation  ONSET DATE: 4-5 years ago  SUBJECTIVE:                                                                                                                                                                                                         SUBJECTIVE STATEMENT: Neck is still feeling the same. She is still having migraines due to neck tension, she had one this past Tuesday and she feels like one might be coming on now. 7/10 pain in the neck currently.   Hand dominance: Right  PERTINENT HISTORY:  ADHD; Anxiety; Asthma; Depression   PAIN:  Are you having pain? Yes: NPRS scale: 5 Pain location: bilateral upper trap area & shoulders Pain description: dull, achy, sharp, intense Aggravating factors: not having a comfortable  pillow; sitting in a car for a long period of time with neck unsupported; bending over with head down Relieving factors: falling asleep  PRECAUTIONS: None  RED FLAGS: None     WEIGHT BEARING RESTRICTIONS: No  FALLS:  Has patient fallen in last 6 months? 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  DIAGNOSTIC FINDINGS:  None  PATIENT SURVEYS:  NDI: 16/50 32%  COGNITION: Overall cognitive status: Within functional limits for tasks assessed  SENSATION: WFL  POSTURE: rounded shoulders and forward head  PALPATION: Tenderness with palpation Rt upper trap Tenderness and increased muscle spasms right thoracic paraspinals   Good joint mobility in cervical and thoracic spine Pain with C PA on left side (shooting pain to top of head)  CERVICAL ROM: * pain  Active ROM A/PROM (deg) eval  Flexion 40*  Extension 30  Right lateral flexion 30*  Left lateral flexion 40  Right rotation 90*  Left rotation 90*   (Blank rows = not tested)  UPPER EXTREMITY ROM: WFL bilateral patient verbalized some left upper trap pain with all shoulder motion  UPPER EXTREMITY MMT: Grossly 4+/5 more challenging on Rt compared to Lt . Bilateral lower + middle trap 3+/5    FUNCTIONAL TESTS:  5 times sit to stand: To be tested at next section  TREATMENT DATE:  08/13/23: UBE 5 min forward only while discussing status Melt method foam roll: rotation, circles, nods 10x each Supine with foam  roll vertical length of spine with bil UE elevation 12x with deep breathing  Supine with foam roll vertical length of spine with bilateral scapular retraction squeezes + diaphragmatic breathing 2x10  Quadruped with foam roll: childs pose and thread the needle 10x each Standing green band bil row/abduction/external rotation (face pulls) 10x  Standing green band bil shoulder extension 10x anchored over the top of the door  07/28/2023 UBE 5 min forward only while discussing status Melt method foam roll: rotation, circles, nods 8x each Supine with foam roll vertical length of spine with bil UE elevation 8x with deep breathing (Added to HEP- see below) Quadruped with foam roll: childs pose and thread the needle 8x each Seated thoracic extension with ball, hands behind neck 10x (Added to HEP- see below) Seated cervical rotation with towel (self mobilization) (Added to HEP- see below) Standing green band bil row 10x anchored on doorknob (Added to HEP- see below) Standing green band bil shoulder extension 10x anchored over the top of the door  (Added to HEP- see below) Pt given green band for home  07/20/2023 Initial Evaluation & HEP created                                                                                                                               if treatment provided at initial evaluation, no treatment charged due to lack of authorization.   PATIENT EDUCATION:  Education details: PT eval findings, anticipated POC, progress with PT, and initial HEP Person educated: Patient Education method: Explanation,  Demonstration, and Handouts Education comprehension: verbalized understanding, returned demonstration, and needs further education  HOME EXERCISE PROGRAM: Access Code: [email or web address] URL: [email or web address] Date: 07/28/2023 Prepared by: Glade Pesa  Exercises - Standing Shoulder Row with Anchored Resistance  - 1 x daily - 7 x weekly - 1-2 sets - 10 reps -  Shoulder extension with resistance - Neutral  - 1 x daily - 7 x weekly - 1-2 sets - 10 reps - Cat Cow  - 1 x daily - 7 x weekly - 1 sets - 10 reps - Sidelying Thoracic Rotation with Open Book  - 1 x daily - 7 x weekly - 1 sets - 10 reps - Overhead Reach on Foam Roll  - 1 x daily - 7 x weekly - 1 sets - 10 reps - Seated Thoracic Lumbar Extension with Pectoralis Stretch  - 1 x daily - 7 x weekly - 1 sets - 10 reps - Seated Assisted Cervical Rotation with Towel  - 1 x daily - 7 x weekly - 1 sets - 10 reps - Standing Bilateral Low Shoulder Row with Anchored Resistance  - 1 x daily - 7 x weekly - 1 sets - 10 reps - Standing Shoulder Extension with Resistance  - 1 x daily - 7 x weekly - 1 sets - 10 reps  ASSESSMENT:  CLINICAL IMPRESSION: The patient responded well to mobility ex's with emphasis on cervical rotation, thoracic rotation and extension.  Therapist progressing and updating HEP for increased intensity and challenge level for further strengthening and functional mobility.  Cueing for diaphragmatic breathing was needed with all of today's exercises. Therapist monitoring response and providing verbal cues to optimize exercise technique.  She reports some pain reduction at the end of session.  OBJECTIVE IMPAIRMENTS: decreased ROM, decreased strength, increased muscle spasms, impaired flexibility, impaired tone, postural dysfunction, and pain.   ACTIVITY LIMITATIONS: lifting, bending, sitting, and reach over head  PARTICIPATION LIMITATIONS: interpersonal relationship, community activity, and occupation  PERSONAL FACTORS: Fitness, Time since onset of injury/illness/exacerbation, and 3+ comorbidities: ADHD; Anxiety; Asthma; Depression  are also affecting patient's functional outcome.   REHAB POTENTIAL: Good  CLINICAL DECISION MAKING: Evolving/moderate complexity  EVALUATION COMPLEXITY: Low   GOALS: Goals reviewed with patient? Yes  SHORT TERM GOALS: Target date: 08/17/2023  Patient will  be independent with initial HEP. Baseline:  Goal status: INITIAL  2.  Patient will report > or = to 40% improvement in neck pain  since starting PT. Baseline:  Goal status: INITIAL   3.  Patient will demonstrate correct lifting technique with no verbal cues to prevent injury at work. Baseline:  Goal status: INITIAL   LONG TERM GOALS: Target date: 09/14/2023  Patient will demonstrate independence in advanced HEP. Baseline:  Goal status: INITIAL  2.  Patient will report > or = to 80% improvement in neck pain since starting PT. Baseline:  Goal status: INITIAL  3.  Patient will verbalize and demonstrate self-care strategies to manage pain including tissue mobility practices and change of position. Baseline:  Goal status: INITIAL   4.  Patient will score < or = to 11/50 on NDI due to decreased self perceived disability & improved function Baseline: 16/50 Goal status: INITIAL  5.  Patient will be able to lift > or = to 15-20 lbs with proper form to prevent injury while performing functional tasks. Baseline:  Goal status: INITIAL    PLAN:  PT FREQUENCY: 1-2x/week  PT DURATION: 8 weeks  PLANNED INTERVENTIONS: 02835- PT Re-evaluation, 97110-Therapeutic  exercises, 97530- Therapeutic activity, [id number]- Neuromuscular re-education, [phone]- Self Care, 02859- Manual therapy, [phone]- Canalith repositioning, [phone]- Aquatic Therapy, [phone]- Electrical stimulation (unattended), [phone]- Electrical stimulation (manual), [id number]- Ultrasound, [id number]- Traction (mechanical), [id number]- Ionotophoresis 4mg /ml Dexamethasone, 20560 (1-2 muscles), 20561 (3+ muscles)- Dry Needling, Patient/Family education, Balance training, Stair training, Taping, Joint mobilization, Joint manipulation, Spinal manipulation, Spinal mobilization, Vestibular training, Cryotherapy, and Moist heat  PLAN FOR NEXT SESSION: assess response to additions to HEP;  green band rows/ extensions; UBE; cervical melt method; lower & middle trap  strengthening; lifting technique   Celena Domino, PT, DPT 08/13/23 10:55 AM  [facility] [address], [address] [place], [place] [address] Phone # [phone] Fax [phone]

## 2023-08-19 NOTE — Therapy (Signed)
 OUTPATIENT PHYSICAL THERAPY CERVICAL TREATMENT   Patient Name: Gabriela Matthews MRN: [id number] DOB:Sep 10, 2000, 23 y.o., female Today's Date: 08/20/2023  END OF SESSION:  PT End of Session - 08/20/23 0851     Visit Number 4    Number of Visits 6    Date for PT Re-Evaluation 09/14/23    Authorization Type [employer] 6 visits 7/8-9/5    PT Start Time 0850    PT Stop Time 0930    PT Time Calculation (min) 40 min    Activity Tolerance Patient tolerated treatment well    Behavior During Therapy WFL for tasks assessed/performed           Past Medical History:  Diagnosis Date   ADHD (attention deficit hyperactivity disorder)    Anxiety    Asthma    Depression    OCD (obsessive compulsive disorder)    ODD (oppositional defiant disorder)    Trichotillomania    Past Surgical History:  Procedure Laterality Date   INNER EAR SURGERY     TONSILLECTOMY     Patient Active Problem List   Diagnosis Date Noted   Thrombocytosis 06/16/2023    PCP: Cristopher Bottcher, NP   REFERRING PROVIDER: Dasie Fitch, MD  REFERRING DIAG: M62.89 (ICD-10-CM) - Other specified disorders of muscle M54.2 (ICD-10-CM) - Cervicalgia  THERAPY DIAG:  Cervicalgia  Abnormal posture  Muscle weakness (generalized)  Rationale for Evaluation and Treatment: Rehabilitation  ONSET DATE: 4-5 years ago  SUBJECTIVE:                                                                                                                                                                                                         SUBJECTIVE STATEMENT: I think it's slightly better. I'm still having some pain.   Hand dominance: Right  PERTINENT HISTORY:  ADHD; Anxiety; Asthma; Depression   PAIN:  Are you having pain? Yes: NPRS scale: 5 Pain location: bilateral upper trap area & shoulders Pain description: dull, achy, sharp, intense Aggravating factors: not having a comfortable pillow; sitting in a car for a long  period of time with neck unsupported; bending over with head down Relieving factors: falling asleep  PRECAUTIONS: None  RED FLAGS: None     WEIGHT BEARING RESTRICTIONS: No  FALLS:  Has patient fallen in last 6 months? 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  DIAGNOSTIC FINDINGS:  None  PATIENT SURVEYS:  NDI: 16/50 32%  COGNITION: Overall cognitive status: Within functional limits for tasks assessed  SENSATION: WFL  POSTURE: rounded shoulders and forward head  PALPATION: Tenderness with palpation Rt upper trap Tenderness and increased muscle spasms right thoracic paraspinals   Good joint mobility in cervical and thoracic spine Pain with C PA on left side (shooting pain to top of head)  CERVICAL ROM: * pain  Active ROM A/PROM (deg) eval  Flexion 40*  Extension 30  Right lateral flexion 30*  Left lateral flexion 40  Right rotation 90*  Left rotation 90*   (Blank rows = not tested)  UPPER EXTREMITY ROM: WFL bilateral patient verbalized some left upper trap pain with all shoulder motion  UPPER EXTREMITY MMT: Grossly 4+/5 more challenging on Rt compared to Lt . Bilateral lower + middle trap 3+/5    FUNCTIONAL TESTS:  5 times sit to stand: To be tested at next section  TREATMENT DATE:  08/20/23 UBE 5 min fwd/bwd while discussing status Melt method foam roll: rotation, nods 20x each Attempted cat/cow - unable to do cow so worked on this Child's  pose 3 way using foam roller for assist Prone T 2 x 10, 1x10 over green ball with 2# wts (better) Prone Y 2x10, 1x10 over green ball with 2# wts Prone Y +  pull down to Y x 10 Lifting: using box: floor to stand lifts x 10 Supine with foam roll vertical length of spine with bil UE elevation 8x with deep breathing  Standing green band bil row 20x anchored on doorknob  Standing green band bil shoulder extension 20x high bar on stairs    07/28/2023 UBE 5 min forward only while discussing status Melt method foam roll: rotation, circles, nods 8x each Supine with foam roll vertical length of spine with bil UE elevation 8x with deep breathing (Added to HEP- see below) Quadruped with foam roll: childs pose and thread the needle 8x each Seated thoracic extension with ball, hands behind neck 10x (Added to HEP- see below) Seated cervical rotation with towel (self mobilization) (Added to HEP- see below) Standing green band bil row 10x anchored on doorknob (Added to HEP- see below) Standing green band bil shoulder extension 10x anchored over the top of the door  (Added to HEP- see below) Pt given green band for home    07/20/2023 Initial Evaluation & HEP created                                                                                                                                If treatment provided at initial evaluation, no treatment charged due to lack of authorization.  PATIENT EDUCATION:  Education details: PT eval findings, anticipated POC, progress with PT, and initial HEP Person educated: Patient Education method: Explanation, Demonstration, and Handouts Education comprehension: verbalized understanding, returned demonstration, and needs further education  HOME EXERCISE PROGRAM: Access Code: [email or web address] URL: [email or web address] Date: 07/28/2023 Prepared by: Glade Pesa  Exercises - Standing Shoulder Row with Anchored Resistance  - 1 x daily - 7 x  weekly - 1-2 sets - 10 reps - Shoulder extension with resistance - Neutral  - 1 x daily - 7 x weekly - 1-2 sets - 10 reps - Cat Cow  - 1 x daily - 7 x weekly - 1 sets - 10 reps - Sidelying Thoracic Rotation with Open Book  - 1 x daily - 7 x weekly - 1 sets - 10 reps - Overhead Reach on Foam Roll  - 1 x daily - 7 x weekly - 1 sets - 10 reps - Seated Thoracic Lumbar Extension with Pectoralis Stretch  - 1 x daily - 7 x weekly - 1 sets - 10 reps - Seated Assisted Cervical Rotation with Towel  - 1 x daily - 7 x weekly - 1 sets - 10 reps - Standing Bilateral Low Shoulder Row with Anchored Resistance  - 1 x daily - 7 x weekly - 1 sets - 10 reps - Standing Shoulder Extension with Resistance  - 1 x daily - 7 x weekly - 1 sets - 10 reps  ASSESSMENT:  CLINICAL IMPRESSION: Patient reports 30% improvement overall in pain. She has been doing HEP about 3x/wk. States she has been having other medical issues, high HR and asthma which makes it difficult. Patient with difficulty doing cow portion of cat/cow in lower thoracic spine. She did better after prone press ups to get the feel for lumbar extension. Prone T and Y challenging when wt added. Assess form again next session and then progress HEP. We reviewed lifting techniques and pt demonstrates good form. Patient states resisted band exercises feel really good and relaxing.      OBJECTIVE IMPAIRMENTS: decreased ROM, decreased strength, increased muscle spasms, impaired flexibility, impaired tone, postural dysfunction, and pain.   ACTIVITY LIMITATIONS: lifting, bending, sitting, and reach over head  PARTICIPATION LIMITATIONS: interpersonal relationship, community activity, and occupation  PERSONAL FACTORS: Fitness, Time since onset of injury/illness/exacerbation, and 3+ comorbidities: ADHD; Anxiety; Asthma; Depression  are also affecting patient's functional outcome.   REHAB POTENTIAL: Good  CLINICAL DECISION MAKING: Evolving/moderate  complexity  EVALUATION COMPLEXITY: Low   GOALS: Goals reviewed with patient? Yes  SHORT TERM GOALS: Target date: 08/17/2023  Patient will be independent with initial HEP. Baseline:  Goal status: MET  2.  Patient will report > or = to 40% improvement in neck pain  since starting PT. Baseline:  Goal status: IN PROGRESS 30% better 8/8   3.  Patient will demonstrate correct lifting technique with no verbal cues to prevent injury at work. Baseline:  Goal status: MET   LONG TERM GOALS: Target date: 09/14/2023  Patient will demonstrate independence in advanced HEP. Baseline:  Goal status: INITIAL  2.  Patient will report > or = to 80% improvement in neck pain since starting PT. Baseline:  Goal status: INITIAL  3.  Patient will verbalize and demonstrate self-care strategies to manage pain including tissue mobility practices and change of position. Baseline:  Goal status: INITIAL   4.  Patient will score < or = to 11/50 on NDI due to decreased self perceived disability & improved function Baseline:  16/50 Goal status: INITIAL  5.  Patient will be able to lift > or = to 15-20 lbs with proper form to prevent injury while performing functional tasks. Baseline:  Goal status: INITIAL    PLAN:  PT FREQUENCY: 1-2x/week  PT DURATION: 8 weeks  PLANNED INTERVENTIONS: 97164- PT Re-evaluation, 97110-Therapeutic exercises, 97530- Therapeutic activity, 97112- Neuromuscular re-education, 97535- Self Care, 02859- Manual therapy, [phone]- Canalith repositioning, [id number]- Aquatic Therapy, [phone]- Electrical stimulation (unattended), [phone]- Electrical stimulation (manual), [id number]- Ultrasound, [id number]- Traction (mechanical), [id number]- Ionotophoresis 4mg /ml Dexamethasone, 79439 (1-2 muscles), 20561 (3+ muscles)- Dry Needling, Patient/Family education, Balance training, Stair training, Taping, Joint mobilization, Joint manipulation, Spinal manipulation, Spinal mobilization, Vestibular training, Cryotherapy,  and Moist heat  PLAN FOR NEXT SESSION: Progress HEP with T and Y,  UBE; cervical melt method; lower & middle trap strengthening   Mliss Cummins, PT  08/20/23 12:09 PM Phone: [phone] Fax: [phone]  [facility] [address] [place], [place] [address] Phone # [phone] Fax [phone]

## 2023-08-20 ENCOUNTER — Encounter: Payer: Self-pay | Admitting: Physical Therapy

## 2023-08-20 ENCOUNTER — Ambulatory Visit: Admitting: Physical Therapy

## 2023-08-20 DIAGNOSIS — M6281 Muscle weakness (generalized): Secondary | ICD-10-CM

## 2023-08-20 DIAGNOSIS — R293 Abnormal posture: Secondary | ICD-10-CM

## 2023-08-20 DIAGNOSIS — M542 Cervicalgia: Secondary | ICD-10-CM

## 2023-08-22 NOTE — Therapy (Addendum)
 OUTPATIENT PHYSICAL THERAPY CERVICAL TREATMENT   Patient Name: Gabriela Matthews MRN: [id number] DOB:2000-05-05, 23 y.o., female Today's Date: 08/23/2023  END OF SESSION:  PT End of Session - 08/23/23 0851       Visit Number 5    Number of Visits 7     Date for PT Re-Evaluation 09/14/23     Authorization Type [employer] 6 visits 7/8-9/5    Authorization - Visit Number 4   Authorization - Number of visits 6   Pt Start Time [phone]    PT Stop Time 1015    PT Time Calculation (min) 41 min     Activity Tolerance Patient tolerated treatment well     Behavior During Therapy WFL for tasks assessed/performed        Past Medical History:  Diagnosis Date   ADHD (attention deficit hyperactivity disorder)    Anxiety    Asthma    Depression    OCD (obsessive compulsive disorder)    ODD (oppositional defiant disorder)    Trichotillomania    Past Surgical History:  Procedure Laterality Date   INNER EAR SURGERY     TONSILLECTOMY     Patient Active Problem List   Diagnosis Date Noted   Thrombocytosis 06/16/2023    PCP: Cristopher Bottcher, NP   REFERRING PROVIDER: Dasie Fitch, MD  REFERRING DIAG: M62.89 (ICD-10-CM) - Other specified disorders of muscle M54.2 (ICD-10-CM) - Cervicalgia  THERAPY DIAG:  Cervicalgia  Abnormal posture  Muscle weakness (generalized)  Rationale for Evaluation and Treatment: Rehabilitation  ONSET DATE: 4-5 years ago  SUBJECTIVE:                                                                                                                                                                                                         SUBJECTIVE STATEMENT: Woke up with pain.    Hand dominance: Right  PERTINENT HISTORY:  ADHD; Anxiety; Asthma; Depression   PAIN:  Are you having pain? Yes: NPRS scale: 6 Pain location: bilateral upper trap area & shoulders Pain description: dull, achy, sharp, intense Aggravating factors: not having a comfortable  pillow; sitting in a car for a long period of time with neck unsupported; bending over with head down Relieving factors: falling asleep  PRECAUTIONS: None  RED FLAGS: None     WEIGHT BEARING RESTRICTIONS: No  FALLS:  Has patient fallen in last 6 months? 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  DIAGNOSTIC FINDINGS:  None  PATIENT SURVEYS:  NDI: 16/50 32%  08/23/23  13 / 50 = 26.0 %  COGNITION: Overall cognitive status: Within functional limits for tasks assessed  SENSATION: WFL  POSTURE: rounded shoulders and forward head  PALPATION: Tenderness with palpation Rt upper trap Tenderness and increased muscle spasms right thoracic paraspinals   Good joint mobility in cervical and thoracic spine Pain with C PA on left side (shooting pain to top of head)  CERVICAL ROM: * pain  Active ROM A/PROM (deg) eval  Flexion 40*  Extension 30  Right lateral flexion 30*  Left lateral flexion 40  Right rotation 90*  Left rotation 90*   (Blank rows = not tested)  UPPER EXTREMITY ROM: WFL bilateral patient verbalized some left upper trap pain with all shoulder motion  UPPER EXTREMITY MMT: Grossly 4+/5 more challenging on Rt compared to Lt . Bilateral lower + middle trap 3+/5    FUNCTIONAL TESTS:  5 times sit to stand: To be tested at next section  TREATMENT DATE:  08/23/23 UBE 5 min fwd/bwd while discussing status NDI completed  Matrix Rows 10# 2x10 cues for  core engagement Matrix low row (anchor 1) 2 x 10 Matrix lat pull 30# 2x10 Seated lat pull green x 20 Horizontal ABD 2x 10 (second set against wall) Standing green band bil shoulder extension 20x high bar on matrix Quadriped Y with green band x 10 B (moved to standing) Standing Y green from hip x 10 B cues for head position Seated cervical retraction 5 sec hold x 10 Seated scapular retraction + ER green x 10   08/20/23 UBE 5 min fwd/bwd while discussing status Melt method foam roll: rotation, nods 20x each Attempted cat/cow - unable to do cow so worked on this Child's pose 3 way using foam roller for assist Prone T 2 x 10, 1x10 over green ball with 2# wts (better) Prone Y 2x10, 1x10 over green ball with 2# wts Prone Y +  pull down to Y x 10 Lifting: using box: floor to stand lifts x 10 Supine with foam roll vertical length of spine with bil UE elevation 8x with deep breathing  Standing green band bil row 20x anchored on doorknob  Standing green band bil shoulder extension 20x high bar on stairs    07/28/2023 UBE 5 min forward only while discussing status Melt method foam roll: rotation, circles, nods 8x each Supine with foam roll vertical length of spine with bil UE elevation 8x with deep breathing (Added to HEP- see below) Quadruped with foam roll: childs pose and thread the needle 8x each Seated thoracic extension with ball, hands behind neck 10x (Added to HEP- see below) Seated cervical rotation with towel (self mobilization) (Added to HEP- see below) Standing green band bil row 10x anchored on doorknob (Added to HEP- see below) Standing green band bil shoulder extension 10x anchored over the top of the door  (Added to HEP- see below) Pt given green band for home    07/20/2023 Initial Evaluation & HEP created  If treatment provided at initial  evaluation, no treatment charged due to lack of authorization.        PATIENT EDUCATION:  Education details: PT eval findings, anticipated POC, progress with PT, and initial HEP Person educated: Patient Education method: Explanation, Demonstration, and Handouts Education comprehension: verbalized understanding, returned demonstration, and needs further education  HOME EXERCISE PROGRAM: Access Code: [email or web address] URL: [email or web address] Date: 08/23/2023 Prepared by: Mliss  Exercises - Standing Shoulder Row with Anchored Resistance  - 1 x daily - 7 x weekly - 1-2 sets - 10 reps - Shoulder extension with resistance - Neutral  - 1 x daily - 7 x weekly - 1-2 sets - 10 reps - Cat Cow  - 1 x daily - 7 x weekly - 1 sets - 10 reps - Sidelying Thoracic Rotation with Open Book  - 1 x daily - 7 x weekly - 1 sets - 10 reps - Overhead Reach on Foam Roll  - 1 x daily - 7 x weekly - 1 sets - 10 reps - Seated Thoracic Lumbar Extension with Pectoralis Stretch  - 1 x daily - 7 x weekly - 1 sets - 10 reps - Seated Assisted Cervical Rotation with Towel  - 1 x daily - 7 x weekly - 1 sets - 10 reps - Seated Lat Pull Down with Resistance - Elbows Bent  - 1 x daily - 7 x weekly - 1-2 sets - 10 reps - Seated Cervical Retraction  - 2 x daily - 7 x weekly - 1 sets - 10 reps - 5 hold - Standing Shoulder Horizontal Abduction with Resistance  - 1 x daily - 3 x weekly - 2 sets - 10 reps - Standing Shoulder Diagonal Horizontal Abduction 60/120 Degrees with Resistance  - 1 x daily - 3 x weekly - 2 sets - 10 reps - Seated Bilateral Shoulder External Rotation with Resistance  - 1 x daily - 3 x weekly - 2 sets - 10 reps -   ASSESSMENT:  CLINICAL IMPRESSION: Patient with increased pain today. She reports she sleeps with 4 pillows on her side. Advised only one pillow and using towel roll to support her neck. We focused on postural strengthening today. Patient requires ongoing cues for head position and  occasional cues for scapular engagement. Good demonstration of chin tucks today. Plan to continue to focus on patient's posture as this is likely the primary contributor to her pain.      OBJECTIVE IMPAIRMENTS: decreased ROM, decreased strength, increased muscle spasms, impaired flexibility, impaired tone, postural dysfunction, and pain.   ACTIVITY LIMITATIONS: lifting, bending, sitting, and reach over head  PARTICIPATION LIMITATIONS: interpersonal relationship, community activity, and occupation  PERSONAL FACTORS: Fitness, Time since onset of injury/illness/exacerbation, and 3+ comorbidities: ADHD; Anxiety; Asthma; Depression  are also affecting patient's functional outcome.   REHAB POTENTIAL: Good  CLINICAL DECISION MAKING: Evolving/moderate complexity  EVALUATION COMPLEXITY: Low   GOALS: Goals reviewed with patient? Yes  SHORT TERM GOALS: Target date: 08/17/2023  Patient will be independent with initial HEP. Baseline:  Goal status: MET  2.  Patient will report > or = to 40% improvement in neck pain  since starting PT. Baseline:  Goal status: IN PROGRESS 30% better 8/8   3.  Patient will demonstrate correct lifting technique with no verbal cues to prevent injury at work. Baseline:  Goal status: MET   LONG TERM GOALS: Target date: 09/14/2023  Patient will demonstrate independence in advanced HEP. Baseline:  Goal status: INITIAL  2.  Patient will report > or = to 80% improvement in neck pain since starting PT. Baseline:  Goal status: INITIAL  3.  Patient will verbalize and demonstrate self-care strategies to manage pain including tissue mobility practices and change of position. Baseline:  Goal status: INITIAL   4.  Patient will score < or = to 11/50 on NDI due to decreased self perceived disability & improved function Baseline: 16/50 Goal status: IN PROGRESS 13 / 50 = 26.0 %  5.  Patient will be able to lift > or = to 15-20 lbs with proper form to prevent injury  while performing functional tasks. Baseline:  Goal status: INITIAL    PLAN:  PT FREQUENCY: 1-2x/week  PT DURATION: 8 weeks  PLANNED INTERVENTIONS: 97164- PT Re-evaluation, 97110-Therapeutic exercises, 97530- Therapeutic activity, 97112- Neuromuscular re-education, 97535- Self Care, 02859- Manual therapy, [phone]- Canalith repositioning, [id number]- Aquatic Therapy, H9716- Electrical stimulation (unattended), [phone]- Electrical stimulation (manual), [id number]- Ultrasound, [id number]- Traction (mechanical), [id number]- Ionotophoresis 4mg /ml Dexamethasone, 79439 (1-2 muscles), 20561 (3+ muscles)- Dry Needling, Patient/Family education, Balance training, Stair training, Taping, Joint mobilization, Joint manipulation, Spinal manipulation, Spinal mobilization, Vestibular training, Cryotherapy, and Moist heat  PLAN FOR NEXT SESSION: focus on postural muscles; submit Re-auth next visit.   Mliss Cummins, PT  08/27/23 9:23 AM Phone: [phone] Fax: [phone]  [facility] [address] [place], [place] [address] Phone # [phone] Fax [phone]

## 2023-08-23 ENCOUNTER — Ambulatory Visit: Admitting: Physical Therapy

## 2023-08-23 ENCOUNTER — Encounter: Payer: Self-pay | Admitting: Physical Therapy

## 2023-08-23 DIAGNOSIS — M6281 Muscle weakness (generalized): Secondary | ICD-10-CM

## 2023-08-23 DIAGNOSIS — M542 Cervicalgia: Secondary | ICD-10-CM

## 2023-08-23 DIAGNOSIS — R293 Abnormal posture: Secondary | ICD-10-CM

## 2023-08-30 ENCOUNTER — Ambulatory Visit: Admitting: Physical Therapy

## 2023-08-30 ENCOUNTER — Encounter: Payer: Self-pay | Admitting: Physical Therapy

## 2023-08-30 DIAGNOSIS — M542 Cervicalgia: Secondary | ICD-10-CM

## 2023-08-30 DIAGNOSIS — R293 Abnormal posture: Secondary | ICD-10-CM

## 2023-08-30 DIAGNOSIS — M6281 Muscle weakness (generalized): Secondary | ICD-10-CM

## 2023-08-30 NOTE — Therapy (Signed)
 OUTPATIENT PHYSICAL THERAPY CERVICAL TREATMENT   Patient Name: Gabriela Matthews MRN: [id number] DOB:02-23-2000, 23 y.o., female Today's Date: 08/23/2023  END OF SESSION:  PT End of Session - 08/30/23 0934     Visit Number 6    Number of Visits 7    Date for PT Re-Evaluation 11/08/23    Authorization Type [employer] 6 visits 7/8-9/5  New auth pending  VL 27    Authorization Time Period 7/8 to 9/5    Authorization - Visit Number 5    Authorization - Number of Visits 6    PT Start Time 0933    PT Stop Time 1011    PT Time Calculation (min) 38 min    Activity Tolerance Patient tolerated treatment well    Behavior During Therapy [facility] for tasks assessed/performed          PT End of Session - 08/30/23 0934     Visit Number 6    Number of Visits 7    Date for PT Re-Evaluation 11/08/23    Authorization Type [employer] 6 visits 7/8-9/5  New auth pending  VL 27    Authorization Time Period 7/8 to 9/5    Authorization - Visit Number 5    Authorization - Number of Visits 6    PT Start Time 0933    PT Stop Time 1011    PT Time Calculation (min) 38 min    Activity Tolerance Patient tolerated treatment well    Behavior During Therapy [facility] for tasks assessed/performed         PT End of Session - 08/23/23 0851       Visit Number 5    Number of Visits 7     Date for PT Re-Evaluation 09/14/23     Authorization Type [employer] 6 visits 7/8-9/5    Authorization - Visit Number 4   Authorization - Number of visits 6   Pt Start Time 0934    PT Stop Time 1015    PT Time Calculation (min) 41 min     Activity Tolerance Patient tolerated treatment well     Behavior During Therapy WFL for tasks assessed/performed        Past Medical History:  Diagnosis Date   ADHD (attention deficit hyperactivity disorder)    Anxiety    Asthma    Depression    OCD (obsessive compulsive disorder)    ODD (oppositional defiant disorder)    Trichotillomania    Past Surgical History:  Procedure  Laterality Date   INNER EAR SURGERY     TONSILLECTOMY     Patient Active Problem List   Diagnosis Date Noted   Thrombocytosis 06/16/2023    PCP: Cristopher Bottcher, NP   REFERRING PROVIDER: Dasie Fitch, MD  REFERRING DIAG: M62.89 (ICD-10-CM) - Other specified disorders of muscle M54.2 (ICD-10-CM) - Cervicalgia  THERAPY DIAG:  Cervicalgia  Abnormal posture  Muscle weakness (generalized)  Rationale for Evaluation and Treatment: Rehabilitation  ONSET DATE: 4-5 years ago  SUBJECTIVE:  SUBJECTIVE STATEMENT: I think it's doing better. (Patient reports 40%)  Hand dominance: Right  PERTINENT HISTORY:  ADHD; Anxiety; Asthma; Depression   PAIN:  Are you having pain? Yes: NPRS scale: 0 Pain location: bilateral upper trap area & shoulders Pain description: dull, achy, sharp, intense Aggravating factors: not having a comfortable pillow; sitting in a car for a long period of time with neck unsupported; bending over with head down Relieving factors: falling asleep  PRECAUTIONS: None  RED FLAGS: None     WEIGHT BEARING RESTRICTIONS: No  FALLS:  Has patient fallen in last 6 months? 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  DIAGNOSTIC FINDINGS:  None  PATIENT  SURVEYS:  NDI: 16/50 32%  08/23/23  13 / 50 = 26.0 %  COGNITION: Overall cognitive status: Within functional limits for tasks assessed  SENSATION: WFL  POSTURE: rounded shoulders and forward head  PALPATION: Tenderness with palpation Rt upper trap Tenderness and increased muscle spasms right thoracic paraspinals   Good joint mobility in cervical and thoracic spine Pain with C PA on left side (shooting pain to top of head)  CERVICAL ROM: * pain  Active ROM A/PROM (deg) eval  Flexion 40*  Extension 30  Right lateral flexion 30*  Left lateral flexion 40  Right rotation 90*  Left rotation 90*   (Blank rows = not tested)  UPPER EXTREMITY ROM: WFL bilateral patient verbalized some left upper trap pain with all shoulder motion  UPPER EXTREMITY MMT: Grossly 4+/5 more challenging on Rt compared to Lt . Bilateral lower + middle trap 3+/5    FUNCTIONAL TESTS:  5 times sit to stand: To be tested at next section  TREATMENT DATE:  08/30/23 UBE 5 min fwd/bwd while discussing status - improved posture today Matrix Rows 10# 2x10 cues for core engagement Matrix low row 10# (anchor 1) 2 x 10 Matrix lat pull 30# 2x10 Horizontal ABD green 2x 10  Standing green band bil shoulder extension 2x12 high bar on matrix Standing Y green from hip 2x 10 B pt correcting head position independently Seated scapular retraction + ER green 2 x 10 Seated cervical retraction 5 sec hold x 10 Standing cervical retraction at wall with red band resistance x 10  Standing serratus push with green - too difficult so tried supine, then moved to 5 # wt unilaterally 2 x 10 ea   08/23/23 UBE 5 min fwd/bwd while discussing status NDI completed  Matrix Rows 10# 2x10 cues for core engagement Matrix low row (anchor 1) 2 x 10 Matrix lat pull 30# 2x10 Seated lat pull green x 20 Horizontal ABD 2x 10 (second set against wall) Standing green band bil shoulder extension 20x high bar on matrix Quadriped Y with green  band x 10 B (moved to standing) Standing Y green from hip x 10 B cues for head position Seated cervical retraction 5 sec hold x 10 Seated scapular retraction + ER green x 10   08/20/23 UBE 5 min fwd/bwd while discussing status Melt method foam roll: rotation, nods 20x each Attempted cat/cow -  unable to do cow so worked on this Child's pose 3 way using foam roller for assist Prone T 2 x 10, 1x10 over green ball with 2# wts (better) Prone Y 2x10, 1x10 over green ball with 2# wts Prone Y +  pull down to Y x 10 Lifting: using box: floor to stand lifts x 10 Supine with foam roll vertical length of spine with bil UE elevation 8x with deep breathing  Standing green band bil row 20x anchored on doorknob  Standing green band bil shoulder extension 20x high bar on stairs    07/28/2023 UBE 5 min forward only while discussing status Melt method foam roll: rotation, circles, nods 8x each Supine with foam roll vertical length of spine with bil UE elevation 8x with deep breathing (Added to HEP- see below) Quadruped with foam roll: childs pose and thread the needle 8x each Seated thoracic extension with ball, hands behind neck 10x (Added to HEP- see below) Seated cervical rotation with towel (self mobilization) (Added to HEP- see below) Standing green band bil row 10x anchored on doorknob (Added to HEP- see below) Standing green band bil shoulder extension 10x anchored over the top of the door  (Added to HEP- see below) Pt given green band for home    07/20/2023 Initial Evaluation & HEP created                                                                                                                                If treatment provided at initial evaluation, no treatment charged due to lack of authorization.        PATIENT EDUCATION:  Education details: PT eval findings, anticipated POC, progress with PT, and initial HEP Person educated: Patient Education method: Explanation,  Demonstration, and Handouts Education comprehension: verbalized understanding, returned demonstration, and needs further education  HOME EXERCISE PROGRAM: Access Code: [email or web address] URL: [email or web address] Date: 08/23/2023 Prepared by: Mliss  Exercises - Standing Shoulder Row with Anchored Resistance  - 1 x daily - 7 x weekly - 1-2 sets - 10 reps - Shoulder extension with resistance - Neutral  - 1 x daily - 7 x weekly - 1-2 sets - 10 reps - Cat Cow  - 1 x daily - 7 x weekly - 1 sets - 10 reps - Sidelying Thoracic Rotation with Open Book  - 1 x daily - 7 x weekly - 1 sets - 10 reps - Overhead Reach on Foam Roll  - 1 x daily - 7 x weekly - 1 sets - 10 reps - Seated Thoracic Lumbar Extension with Pectoralis Stretch  - 1 x daily - 7 x weekly - 1 sets - 10 reps - Seated Assisted Cervical Rotation with Towel  - 1 x daily - 7 x weekly - 1 sets - 10 reps - Seated Lat Pull Down with Resistance - Elbows Bent  - 1 x daily - 7 x weekly - 1-2 sets - 10  reps - Seated Cervical Retraction  - 2 x daily - 7 x weekly - 1 sets - 10 reps - 5 hold - Standing Shoulder Horizontal Abduction with Resistance  - 1 x daily - 3 x weekly - 2 sets - 10 reps - Standing Shoulder Diagonal Horizontal Abduction 60/120 Degrees with Resistance  - 1 x daily - 3 x weekly - 2 sets - 10 reps - Seated Bilateral Shoulder External Rotation with Resistance  - 1 x daily - 3 x weekly - 2 sets - 10 reps -   ASSESSMENT:  CLINICAL IMPRESSION: Patient reports 40% improvement overall and reports no pain today. Her NDI has improved 3 points showing functional improvement. She demonstrates improved posture overall, but does still need occasional cues to avoid neck flexion. She continues to experience intermittent neck pain. Sleeping is improving. She self corrected her posture on many exercises today including bent over rows and shoulder diagonals. She demonstrates decreased strength on her dominant R side compared to left. She has  met all of her STG and is progressing appropriately. She continues to demonstrate potential for improvement and would benefit from continued skilled therapy to address impairments.  Patient cannot return until 09/15/23, so I am re-certifying her today. Re-auth was also submitted today.     OBJECTIVE IMPAIRMENTS: decreased ROM, decreased strength, increased muscle spasms, impaired flexibility, impaired tone, postural dysfunction, and pain.   ACTIVITY LIMITATIONS: lifting, bending, sitting, and reach over head  PARTICIPATION LIMITATIONS: interpersonal relationship, community activity, and occupation  PERSONAL FACTORS: Fitness, Time since onset of injury/illness/exacerbation, and 3+ comorbidities: ADHD; Anxiety; Asthma; Depression  are also affecting patient's functional outcome.   REHAB POTENTIAL: Good  CLINICAL DECISION MAKING: Evolving/moderate complexity  EVALUATION COMPLEXITY: Low   GOALS: Goals reviewed with patient? Yes  SHORT TERM GOALS: Target date: 08/17/2023  Patient will be independent with initial HEP. Baseline:  Goal status: MET  2.  Patient will report > or = to 40% improvement in neck pain  since starting PT. Baseline:  Goal status: MET 8/18   3.  Patient will demonstrate correct lifting technique with no verbal cues to prevent injury at work. Baseline:  Goal status: MET   LONG TERM GOALS: Target date: 11/08/23 / Patient will demonstrate independence in advanced HEP. Baseline:  Goal status: INITIAL  2.  Patient will report > or = to 80% improvement in neck pain since starting PT. Baseline:  Goal status: INITIAL  3.  Patient will verbalize and demonstrate self-care strategies to manage pain including tissue mobility practices and change of position. Baseline:  Goal status: INITIAL   4.  Patient will score < or = to 11/50 on NDI due to decreased self perceived disability & improved function Baseline: 16/50 Goal status: IN PROGRESS 13 / 50 = 26.0 %  5.   Patient will be able to lift > or = to 15-20 lbs with proper form to prevent injury while performing functional tasks. Baseline:  Goal status: INITIAL    PLAN:  PT FREQUENCY: 1-2x/week  PT DURATION: 8 weeks  PLANNED INTERVENTIONS: 97164- PT Re-evaluation, 97110-Therapeutic exercises, 97530- Therapeutic activity, 97112- Neuromuscular re-education, 97535- Self Care, 02859- Manual therapy, [phone]- Canalith repositioning, [id number]- Aquatic Therapy, H9716- Electrical stimulation (unattended), [id number]- Electrical stimulation (manual), [id number]- Ultrasound, [id number]- Traction (mechanical), [id number]- Ionotophoresis 4mg /ml Dexamethasone, 79439 (1-2 muscles), 20561 (3+ muscles)- Dry Needling, Patient/Family education, Balance training, Stair training, Taping, Joint mobilization, Joint manipulation, Spinal manipulation, Spinal mobilization, Vestibular training, Cryotherapy, and Moist heat  PLAN FOR NEXT SESSION:  continue to focus on postural muscles.   Check status of re-auth. Re-cert next visit.   Mliss Cummins, PT  08/30/23 1:45 PM Phone: [phone] Fax: [phone]  [facility] [address], [address] [place], [place] [address] Phone # [phone] Fax [phone]

## 2023-09-15 ENCOUNTER — Ambulatory Visit: Payer: Self-pay | Attending: Sports Medicine | Admitting: Physical Therapy

## 2023-09-15 ENCOUNTER — Encounter: Payer: Self-pay | Admitting: Physical Therapy

## 2023-09-15 DIAGNOSIS — R293 Abnormal posture: Secondary | ICD-10-CM | POA: Diagnosis present

## 2023-09-15 DIAGNOSIS — M6281 Muscle weakness (generalized): Secondary | ICD-10-CM | POA: Diagnosis present

## 2023-09-15 DIAGNOSIS — M542 Cervicalgia: Secondary | ICD-10-CM | POA: Insufficient documentation

## 2023-09-15 NOTE — Therapy (Signed)
 OUTPATIENT PHYSICAL THERAPY CERVICAL TREATMENT   Patient Name: Gabriela Matthews MRN: [id number] DOB:May 21, 2000, 23 y.o., female Today's Date: 08/23/2023  END OF SESSION:  PT End of Session - 09/15/23 0852     Visit Number 7    Date for PT Re-Evaluation 10/29/23    Authorization Type [employer] 6 visits  VL 27    Authorization Time Period 8/19 to 10/17    Authorization - Visit Number 1    Authorization - Number of Visits 6    PT Start Time 0846    PT Stop Time 0927    PT Time Calculation (min) 41 min    Activity Tolerance Patient tolerated treatment well    Behavior During Therapy [facility] for tasks assessed/performed           PT End of Session - 09/15/23 0852     Visit Number 7    Date for PT Re-Evaluation 10/29/23    Authorization Type [employer] 6 visits  VL 27    Authorization Time Period 8/19 to 10/17    Authorization - Visit Number 1    Authorization - Number of Visits 6    PT Start Time 0846    PT Stop Time 0927    PT Time Calculation (min) 41 min    Activity Tolerance Patient tolerated treatment well    Behavior During Therapy [facility] for tasks assessed/performed          PT End of Session - 08/23/23 0851       Visit Number 5    Number of Visits 7     Date for PT Re-Evaluation 09/14/23     Authorization Type [employer] 6 visits 7/8-9/5    Authorization - Visit Number 4   Authorization - Number of visits 6   Pt Start Time 0934    PT Stop Time 1015    PT Time Calculation (min) 41 min     Activity Tolerance Patient tolerated treatment well     Behavior During Therapy WFL for tasks assessed/performed        Past Medical History:  Diagnosis Date   ADHD (attention deficit hyperactivity disorder)    Anxiety    Asthma    Depression    OCD (obsessive compulsive disorder)    ODD (oppositional defiant disorder)    Trichotillomania    Past Surgical History:  Procedure Laterality Date   INNER EAR SURGERY     TONSILLECTOMY     Patient Active Problem  List   Diagnosis Date Noted   Thrombocytosis 06/16/2023    PCP: Cristopher Bottcher, NP   REFERRING PROVIDER: Dasie Fitch, MD  REFERRING DIAG: M62.89 (ICD-10-CM) - Other specified disorders of muscle M54.2 (ICD-10-CM) - Cervicalgia  THERAPY DIAG:  Cervicalgia  Abnormal posture  Muscle weakness (generalized)  Rationale for Evaluation and Treatment: Rehabilitation  ONSET DATE: 4-5 years ago  SUBJECTIVE:  SUBJECTIVE STATEMENT: Migraines better. Pain is really related to when I'm working. Overall better.  Hand dominance: Right  PERTINENT HISTORY:  ADHD; Anxiety; Asthma; Depression   PAIN:  Are you having pain? Yes: NPRS scale: 0 Pain location: bilateral upper trap area & shoulders Pain description: dull, achy, sharp, intense Aggravating factors: not having a comfortable pillow; sitting in a car for a long period of time with neck unsupported; bending over with head down Relieving factors: falling asleep  PRECAUTIONS: None  RED FLAGS: None     WEIGHT BEARING RESTRICTIONS: No  FALLS:  Has patient fallen in last 6 months? 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  DIAGNOSTIC FINDINGS:  None  PATIENT SURVEYS:  NDI: 16/50 32%  08/23/23  13 / 50 = 26.0  %  COGNITION: Overall cognitive status: Within functional limits for tasks assessed  SENSATION: WFL  POSTURE: rounded shoulders and forward head  PALPATION: Tenderness with palpation Rt upper trap Tenderness and increased muscle spasms right thoracic paraspinals   Good joint mobility in cervical and thoracic spine Pain with C PA on left side (shooting pain to top of head)  CERVICAL ROM: * pain  Active ROM A/PROM (deg) eval  Flexion 40*  Extension 30  Right lateral flexion 30*  Left lateral flexion 40  Right rotation 90*  Left rotation 90*   (Blank rows = not tested)  UPPER EXTREMITY ROM: WFL bilateral patient verbalized some left upper trap pain with all shoulder motion  UPPER EXTREMITY MMT: Grossly 4+/5 more challenging on Rt compared to Lt . Bilateral lower + middle trap 3+/5    FUNCTIONAL TESTS:  5 times sit to stand: To be tested at next section  TREATMENT DATE:  09/15/23 UBE 6 min fwd/bwd while discussing status - improved posture today Matrix Rows 10# 2x15 cues for core engagement Matrix low row 10# dropped to 5#on second set (anchor 1) 2 x 10 Matrix lat pull 30# 2x10 Horizontal ABD green 2x 10  Standing blue band bil shoulder extension 1x12 high bar on matrix, then on stairs x 12 Standing Y green from hip x15 B pt correcting head position independently Seated scapular retraction + ER green 2 x 10 Seated cervical retraction 5 sec hold x 10 Standing cervical retraction at wall with red band resistance x 10  Standing serratus push with green - too difficult so tried supine, then moved to 5 # wt unilaterally 2 x 10 ea   08/30/23 UBE 5 min fwd/bwd while discussing status - improved posture today Matrix Rows 10# 2x10 cues for core engagement Matrix low row 10# (anchor 1) 2 x 10 Matrix lat pull 30# 2x10 Horizontal ABD green 2x 10  Standing green band bil shoulder extension 2x12 high bar on matrix Standing Y green from hip 2x 10 B pt correcting head position  independently Seated scapular retraction + ER green 2 x 10 Seated cervical retraction 5 sec hold x 10 Standing cervical retraction at wall with red band resistance x 10  Standing OH press 2# B x 20 Standing OH press  08/23/23 UBE 5 min fwd/bwd while discussing status NDI completed  Matrix Rows 10# 2x10 cues for core engagement Matrix low row (anchor 1) 2 x 10 Matrix lat pull 30# 2x10 Seated lat pull green x 20 Horizontal ABD 2x 10 (second set against wall) Standing green band bil shoulder extension 20x high bar on matrix Quadriped Y with green band x 10 B (moved to standing) Standing Y green from hip x 10 B cues for head position Seated cervical retraction 5 sec hold x 10 Seated scapular retraction + ER green x 10   08/20/23 UBE 5 min fwd/bwd while discussing status Melt method foam roll: rotation, nods 20x each Attempted cat/cow - unable to do cow so worked on this Child's pose 3 way using foam roller for assist Prone T 2 x 10, 1x10 over green ball with 2# wts (better) Prone Y 2x10, 1x10 over green ball with 2# wts Prone Y +  pull down to Y x 10 Lifting: using box: floor to stand lifts x 10 Supine with foam roll vertical length of spine with bil UE elevation 8x with deep breathing  Standing green band bil row 20x anchored on doorknob  Standing green band bil shoulder extension 20x high bar on stairs    07/28/2023 UBE 5 min forward only while discussing status Melt method foam roll: rotation, circles, nods 8x each Supine with foam roll vertical length of spine with bil UE elevation 8x with deep breathing (Added to HEP- see below) Quadruped with foam roll: childs pose and thread the needle 8x each Seated thoracic extension with ball, hands behind neck 10x (Added to HEP- see below) Seated cervical rotation with towel (self mobilization) (Added to HEP- see below) Standing green band bil row 10x anchored on doorknob (Added to HEP- see below) Standing green band bil shoulder  extension 10x anchored over the top of the door  (Added to HEP- see below) Pt given green band for home    07/20/2023 Initial Evaluation & HEP created                                                                                                                                If treatment provided at initial evaluation, no treatment charged due to lack of authorization.        PATIENT EDUCATION:  Education details: PT eval findings, anticipated POC, progress with PT, and initial HEP Person educated: Patient Education method: Explanation, Demonstration, and Handouts Education comprehension: verbalized understanding, returned demonstration, and needs further education  HOME EXERCISE PROGRAM: Access Code: [email or web address] URL: [email or web address] Date: 08/23/2023 Prepared by: Mliss  Exercises - Standing Shoulder Row with Anchored Resistance  - 1 x daily - 7 x weekly - 1-2 sets - 10 reps - Shoulder extension with resistance - Neutral  - 1 x daily - 7 x weekly - 1-2 sets - 10 reps - Cat Cow  - 1 x daily - 7 x weekly -  1 sets - 10 reps - Sidelying Thoracic Rotation with Open Book  - 1 x daily - 7 x weekly - 1 sets - 10 reps - Overhead Reach on Foam Roll  - 1 x daily - 7 x weekly - 1 sets - 10 reps - Seated Thoracic Lumbar Extension with Pectoralis Stretch  - 1 x daily - 7 x weekly - 1 sets - 10 reps - Seated Assisted Cervical Rotation with Towel  - 1 x daily - 7 x weekly - 1 sets - 10 reps - Seated Lat Pull Down with Resistance - Elbows Bent  - 1 x daily - 7 x weekly - 1-2 sets - 10 reps - Seated Cervical Retraction  - 2 x daily - 7 x weekly - 1 sets - 10 reps - 5 hold - Standing Shoulder Horizontal Abduction with Resistance  - 1 x daily - 3 x weekly - 2 sets - 10 reps - Standing Shoulder Diagonal Horizontal Abduction 60/120 Degrees with Resistance  - 1 x daily - 3 x weekly - 2 sets - 10 reps - Seated Bilateral Shoulder External Rotation with Resistance  - 1 x daily - 3 x weekly  - 2 sets - 10 reps -   ASSESSMENT:  CLINICAL IMPRESSION: Patient reporting continued improvements since last visit. She is having fewer migraines and less neck pain in general. Pain is more on the days she works. She required some increased cues today on machines. Challenged by [facility] lifts and Ys today.      OBJECTIVE IMPAIRMENTS: decreased ROM, decreased strength, increased muscle spasms, impaired flexibility, impaired tone, postural dysfunction, and pain.   ACTIVITY LIMITATIONS: lifting, bending, sitting, and reach over head  PARTICIPATION LIMITATIONS: interpersonal relationship, community activity, and occupation  PERSONAL FACTORS: Fitness, Time since onset of injury/illness/exacerbation, and 3+ comorbidities: ADHD; Anxiety; Asthma; Depression  are also affecting patient's functional outcome.   REHAB POTENTIAL: Good  CLINICAL DECISION MAKING: Evolving/moderate complexity  EVALUATION COMPLEXITY: Low   GOALS: Goals reviewed with patient? Yes  SHORT TERM GOALS: Target date: 08/17/2023  Patient will be independent with initial HEP. Baseline:  Goal status: MET  2.  Patient will report > or = to 40% improvement in neck pain  since starting PT. Baseline:  Goal status: MET 8/18   3.  Patient will demonstrate correct lifting technique with no verbal cues to prevent injury at work. Baseline:  Goal status: MET   LONG TERM GOALS: Target date: 10/29/23 / Patient will demonstrate independence in advanced HEP. Baseline:  Goal status: INITIAL  2.  Patient will report > or = to 80% improvement in neck pain since starting PT. Baseline:  Goal status: INITIAL  3.  Patient will verbalize and demonstrate self-care strategies to manage pain including tissue mobility practices and change of position. Baseline:  Goal status: INITIAL   4.  Patient will score < or = to 11/50 on NDI due to decreased self perceived disability & improved function Baseline: 16/50 Goal status: IN PROGRESS  13 / 50 = 26.0 %  5.  Patient will be able to lift > or = to 15-20 lbs with proper form to prevent injury while performing functional tasks. Baseline:  Goal status: INITIAL    PLAN:  PT FREQUENCY: 1-2x/week  PT DURATION: 8 weeks  PLANNED INTERVENTIONS: 97164- PT Re-evaluation, 97110-Therapeutic exercises, 97530- Therapeutic activity, [id number]- Neuromuscular re-education, 97535- Self Care, 02859- Manual therapy, [id number]- Canalith repositioning, [id number]- Aquatic Therapy, H9716- Electrical stimulation (unattended), [id number]- Electrical stimulation (manual), [id number]-  Ultrasound, 02987- Traction (mechanical), [id number]- Ionotophoresis 4mg /ml Dexamethasone, 20560 (1-2 muscles), 20561 (3+ muscles)- Dry Needling, Patient/Family education, Balance training, Stair training, Taping, Joint mobilization, Joint manipulation, Spinal manipulation, Spinal mobilization, Vestibular training, Cryotherapy, and Moist heat  PLAN FOR NEXT SESSION: continue to focus on postural muscles.   Mliss Cummins, PT  09/15/23 9:30 AM Phone: [phone] Fax: [phone]  [facility] [address] [place], [place] [address] Phone # [phone] Fax [phone]

## 2023-09-15 NOTE — Addendum Note (Signed)
 Addended by: Keslyn Teater J on: 09/15/2023 09:33 AM   Modules accepted: Orders

## 2023-09-22 ENCOUNTER — Ambulatory Visit: Payer: Self-pay | Admitting: Physical Therapy

## 2023-09-23 ENCOUNTER — Inpatient Hospital Stay: Attending: Oncology

## 2023-09-23 ENCOUNTER — Encounter: Payer: Self-pay | Admitting: Oncology

## 2023-09-23 ENCOUNTER — Inpatient Hospital Stay (HOSPITAL_BASED_OUTPATIENT_CLINIC_OR_DEPARTMENT_OTHER): Admitting: Oncology

## 2023-09-23 ENCOUNTER — Other Ambulatory Visit: Payer: Self-pay

## 2023-09-23 VITALS — BP 131/87 | HR 106 | Temp 97.4°F | Resp 16 | Ht 64.0 in | Wt 167.0 lb

## 2023-09-23 DIAGNOSIS — D75839 Thrombocytosis, unspecified: Secondary | ICD-10-CM | POA: Insufficient documentation

## 2023-09-23 LAB — CBC WITH DIFFERENTIAL (CANCER CENTER ONLY)
Abs Immature Granulocytes: 0.01 K/uL (ref 0.00–0.07)
Basophils Absolute: 0.1 K/uL (ref 0.0–0.1)
Basophils Relative: 1 %
Eosinophils Absolute: 0.2 K/uL (ref 0.0–0.5)
Eosinophils Relative: 4 %
HCT: 39.4 % (ref 36.0–46.0)
Hemoglobin: 13.2 g/dL (ref 12.0–15.0)
Immature Granulocytes: 0 %
Lymphocytes Relative: 30 %
Lymphs Abs: 1.8 K/uL (ref 0.7–4.0)
MCH: 28.9 pg (ref 26.0–34.0)
MCHC: 33.5 g/dL (ref 30.0–36.0)
MCV: 86.4 fL (ref 80.0–100.0)
Monocytes Absolute: 0.7 K/uL (ref 0.1–1.0)
Monocytes Relative: 11 %
Neutro Abs: 3.4 K/uL (ref 1.7–7.7)
Neutrophils Relative %: 54 %
Platelet Count: 447 K/uL — ABNORMAL HIGH (ref 150–400)
RBC: 4.56 MIL/uL (ref 3.87–5.11)
RDW: 12.5 % (ref 11.5–15.5)
WBC Count: 6.2 K/uL (ref 4.0–10.5)
nRBC: 0 % (ref 0.0–0.2)

## 2023-09-23 LAB — CMP (CANCER CENTER ONLY)
ALT: 11 U/L (ref 0–44)
AST: 12 U/L — ABNORMAL LOW (ref 15–41)
Albumin: 4.5 g/dL (ref 3.5–5.0)
Alkaline Phosphatase: 62 U/L (ref 38–126)
Anion gap: 5 (ref 5–15)
BUN: 15 mg/dL (ref 6–20)
CO2: 27 mmol/L (ref 22–32)
Calcium: 9 mg/dL (ref 8.9–10.3)
Chloride: 105 mmol/L (ref 98–111)
Creatinine: 0.69 mg/dL (ref 0.44–1.00)
GFR, Estimated: >60 mL/min (ref >60)
Glucose, Bld: 83 mg/dL (ref 70–99)
Potassium: 4.8 mmol/L (ref 3.5–5.1)
Sodium: 137 mmol/L (ref 135–145)
Total Bilirubin: 0.2 mg/dL (ref 0.0–1.2)
Total Protein: 7.1 g/dL (ref 6.5–8.1)

## 2023-09-23 LAB — FERRITIN: Ferritin: 27 ng/mL (ref 11–307)

## 2023-09-23 LAB — IRON AND IRON BINDING CAPACITY (CC-WL,HP ONLY)
Iron: 87 ug/dL (ref 28–170)
Saturation Ratios: 21 % (ref 10.4–31.8)
TIBC: 419 ug/dL (ref 250–450)
UIBC: 332 ug/dL (ref 148–442)

## 2023-09-23 NOTE — Progress Notes (Signed)
 [facility]  HEMATOLOGY CLINIC PROGRESS NOTE  PATIENT NAME: Gabriela Matthews   MR#: [id number] DOB: [birth year]  Patient Care Team: Cristopher Bottcher, NP as PCP - General (Family Medicine)  Date of visit: 09/23/2023   ASSESSMENT & PLAN:   Gabriela Matthews is a 23 y.o. lady with a past medical history of ADHD, anxiety, depression, OCD, trichotillomania, was referred to our service for evaluation of thrombocytosis.  JAK2 testing followed by reflex testing to include CALR, MPL, exon 12-15 mutations were all negative.  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    Thrombocytosis Chronic, mild thrombocytosis with platelet counts ranging from 424,000 to 499,000 since October last year.   Differential diagnosis includes iron deficiency, infection, inflammation, medication effects, or bone marrow mutation.   No anemia, infection, inflammation, or splenic surgery. Previous iron supplementation caused mild nausea initially but was otherwise tolerated.  On her consultation with us  on 06/16/2023, labs showed stable platelet count of 470,000.  White count, hemoglobin, MCV were all within normal limits.  CMP grossly unremarkable.  Iron studies showed no evidence of iron deficiency.  ESR, CRP, LDH were all within normal limits. JAK2 V617F mutation analysis, followed by reflex testing to include CALR, MPL, exon 12-15 mutations were all negative.  No evidence of primary myeloproliferative neoplasm.   Platelet count today is better at 447,000.  White blood cell count, RBCs and other parameters are all within normal limits.  Possible medication-induced thrombocytosis due to Adderall  or Trileptal, both with less than 1% chance of causing blood count issues. Condition is borderline and not concerning. - Continue Adderall  and Trileptal as thrombocytosis is not severe. - Encourage hydration, possibly with electrolyte-based drinks like Gatorade. - Monitor platelet count; consider further workup if it rises.  If  platelet count is consistently above 500,000, she would benefit from aspirin 81 mg for clot prevention.  RTC in 1 year for follow-up with repeat labs.  She will follow-up with her PCP in the interim.   I spent a total of 20 minutes during this encounter with the patient including review of chart and various tests results, discussions about plan of care and coordination of care plan.  I reviewed lab results and outside records for this visit and discussed relevant results with the patient. Diagnosis, plan of care and treatment options were also discussed in detail with the patient. Opportunity provided to ask questions and answers provided to her apparent satisfaction. Provided instructions to call our clinic with any problems, questions or concerns prior to return visit. I recommended to continue follow-up with PCP and sub-specialists. She verbalized understanding and agreed with the plan. No barriers to learning was detected.  Chinita Patten, MD  09/23/2023 5:30 PM  [facility] - A DEPT OF 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  Workup negative for MPN.  INTERVAL HISTORY:  Discussed the use of AI scribe software for clinical note transcription with the patient, who gave verbal consent to proceed.  History of Present Illness Gabriela Matthews is a 23 year old female who presents for follow-up of elevated platelet count.  Her platelet count has increased from 417,000 in June to 447,000 currently. Previous workup, including tests for bone marrow mutations, was negative, and inflammatory markers were normal.  She is currently taking Adderall  and Trilostane, which have a  low probability of affecting platelet levels. No symptoms of heavy menstrual cycles,  epistaxis, or gum bleeding. She mentions experiencing spotting, which she attributes to a recent change in birth control.  She confirms staying hydrated and drinking plenty of water.    SUMMARY OF HEMATOLOGIC HISTORY:  She was referred by Cristopher Bottcher, NP for evaluation of high platelet count.   She has experienced elevated platelet counts since at least October 2024, with values recorded at 424,000 in October, 450,000 in March, and 499,000 at her primary care visit on 05/03/2023.  She had repeat labs at an urgent care facility on 05/13/2023 and platelet count was 480,000 at the time. No recent infections, inflammation, or medication changes could account for the elevated platelet count. There is no history of spleen issues or clotting problems.   She has a history of iron deficiency and was on iron supplements last year, which initially caused nausea but eventually were well-tolerated without constipation. She does not have heavy menstrual cycles, which are a common cause of iron deficiency, but it is noted that low iron can still occur with regular cycles. Her red and white blood cell counts have remained normal, and she has not been diagnosed with anemia.   She has ongoing abdominal pain that has persisted since undergoing laparoscopic surgery for an ovarian cyst removal on March 28th. The pain has not resolved post-surgery. No sensation of early satiety or pain on the left side of the abdomen. She is under the care of a gynecologist and is not aware of any additional cysts.   No family history of blood disorders. No trouble swallowing or lymph node swelling.  On her consultation with us  on 06/16/2023, labs showed stable platelet count of 470,000.  White count, hemoglobin, MCV were all within normal limits.  CMP grossly unremarkable.  Iron studies showed no evidence of iron deficiency.  ESR, CRP, LDH were all within normal limits. JAK2 V617F mutation analysis, followed by reflex testing to include  CALR, MPL, exon 12-15 mutations were all negative.  No evidence of primary myeloproliferative neoplasm.    Possible medication-induced thrombocytosis due to Adderall  or Trileptal, both with less than 1% chance of causing blood count issues. Condition is borderline and not concerning.  - Continue Adderall  and Trileptal as thrombocytosis is not severe. - Encourage hydration, possibly with electrolyte-based drinks like Gatorade. - Monitor platelet count; consider further workup if it rises.  I have reviewed the past medical history, past surgical history, social history and family history with the patient and they are unchanged from previous note.  ALLERGIES: She is allergic to gabapentin  and other.  MEDICATIONS:  Current Outpatient Medications  Medication Sig Dispense Refill   amphetamine -dextroamphetamine  (ADDERALL  XR) 20 MG 24 hr capsule Take 20 mg by mouth every morning.     cetirizine (ZYRTEC) 10 MG tablet Take 10 mg by mouth daily.     dicyclomine (BENTYL) 20 MG tablet Take 1 tablet by mouth 4 (four) times daily -  before meals and at bedtime.     EPINEPHrine  0.3 mg/0.3 mL IJ SOAJ injection Inject 0.3 mg into the muscle as needed for anaphylaxis. 2 each 0   GALLIFREY 5 MG tablet Take 5 mg by mouth daily.     hydrocortisone 2.5 % cream Apply 1 Application topically 2 (two) times daily as needed.     hydrOXYzine (ATARAX) 50 MG tablet Take 50 mg by mouth 2 (two) times daily as needed.     ibuprofen  (ADVIL ) 600 MG tablet  Take 1 tablet (600 mg total) by mouth every 6 (six) hours as needed. 30 tablet 0   methocarbamol (ROBAXIN) 500 MG tablet Take 500 mg by mouth every 6 (six) hours as needed for muscle spasms.     ondansetron  (ZOFRAN -ODT) 4 MG disintegrating tablet Take 1 tablet (4 mg total) by mouth every 8 (eight) hours as needed. 20 tablet 0   Oxcarbazepine (TRILEPTAL) 300 MG tablet Take 450-1,050 mg by mouth 2 (two) times daily. 450 mg in the am and 1050 mg at bedtime  1   oxcarbazepine  (TRILEPTAL) 600 MG tablet Take 600 mg by mouth 2 (two) times daily. 600mg  in am and 900mg  at HS     QVAR REDIHALER 40 MCG/ACT inhaler Inhale 2 puffs into the lungs 2 (two) times daily.     XOPENEX HFA 45 MCG/ACT inhaler Inhale 2 puffs into the lungs every 4 (four) hours as needed for shortness of breath.     No current facility-administered medications for this visit.     REVIEW OF SYSTEMS:    Review of Systems - Oncology  All other pertinent systems were reviewed with the patient and are negative.  PHYSICAL EXAMINATION:   Onc Performance Status - 09/23/23 1034       ECOG Perf Status   ECOG Perf Status Restricted in physically strenuous activity but ambulatory and able to carry out work of a light or sedentary nature, e.g., light house work, office work      KPS SCALE   KPS % SCORE Normal activity with effort, some s/s of disease          Vitals:   09/23/23 1017  BP: 131/87  Pulse: (!) 106  Resp: 16  Temp: (!) 97.4 F (36.3 C)  SpO2: 99%   Filed Weights   09/23/23 1017  Weight: 167 lb (75.8 kg)    Physical Exam Constitutional:      General: She is not in acute distress.    Appearance: Normal appearance.  HENT:     Head: Normocephalic and atraumatic.  Cardiovascular:     Rate and Rhythm: Normal rate.  Pulmonary:     Effort: Pulmonary effort is normal. No respiratory distress.  Abdominal:     General: There is no distension.  Neurological:     General: No focal deficit present.     Mental Status: She is alert and oriented to person, place, and time.  Psychiatric:        Mood and Affect: Mood normal.        Behavior: Behavior normal.     LABORATORY DATA:   I have reviewed the data as listed.  Results for orders placed or performed in visit on 09/23/23  Iron and Iron Binding Capacity ([facility] only)  Result Value Ref Range   Iron 87 28 - 170 ug/dL   TIBC 580 749 - 549 ug/dL   Saturation Ratios 21 10.4 - 31.8 %   UIBC 332 148 - 442 ug/dL   Ferritin  Result Value Ref Range   Ferritin 27 11 - 307 ng/mL  CMP (Cancer Center only)  Result Value Ref Range   Sodium 137 135 - 145 mmol/L   Potassium 4.8 3.5 - 5.1 mmol/L   Chloride 105 98 - 111 mmol/L   CO2 27 22 - 32 mmol/L   Glucose, Bld 83 70 - 99 mg/dL   BUN 15 6 - 20 mg/dL   Creatinine 9.30 9.55 - 1.00 mg/dL   Calcium 9.0 8.9 -  10.3 mg/dL   Total Protein 7.1 6.5 - 8.1 g/dL   Albumin 4.5 3.5 - 5.0 g/dL   AST 12 (L) 15 - 41 U/L   ALT 11 0 - 44 U/L   Alkaline Phosphatase 62 38 - 126 U/L   Total Bilirubin 0.2 0.0 - 1.2 mg/dL   GFR, Estimated >39 >39 mL/min   Anion gap 5 5 - 15  CBC with Differential (Cancer Center Only)  Result Value Ref Range   WBC Count 6.2 4.0 - 10.5 K/uL   RBC 4.56 3.87 - 5.11 MIL/uL   Hemoglobin 13.2 12.0 - 15.0 g/dL   HCT 60.5 63.9 - 53.9 %   MCV 86.4 80.0 - 100.0 fL   MCH 28.9 26.0 - 34.0 pg   MCHC 33.5 30.0 - 36.0 g/dL   RDW 87.4 88.4 - 84.4 %   Platelet Count 447 (H) 150 - 400 K/uL   nRBC 0.0 0.0 - 0.2 %   Neutrophils Relative % 54 %   Neutro Abs 3.4 1.7 - 7.7 K/uL   Lymphocytes Relative 30 %   Lymphs Abs 1.8 0.7 - 4.0 K/uL   Monocytes Relative 11 %   Monocytes Absolute 0.7 0.1 - 1.0 K/uL   Eosinophils Relative 4 %   Eosinophils Absolute 0.2 0.0 - 0.5 K/uL   Basophils Relative 1 %   Basophils Absolute 0.1 0.0 - 0.1 K/uL   Immature Granulocytes 0 %   Abs Immature Granulocytes 0.01 0.00 - 0.07 K/uL     RADIOGRAPHIC STUDIES:  No recent pertinent imaging studies available to review.  Orders Placed This Encounter  Procedures   CBC with Differential ([facility] Center Only)    Standing Status:   Future    Expiration Date:   09/22/2024     Future Appointments  Date Time Provider Department Center  09/28/2023  8:45 AM Riddles, Mliss PARAS, PT [facility] None  10/04/2023  9:30 AM Russell Josette LABOR, PT [facility] None  10/13/2023  8:15 AM Delford Maude BROCKS, MD [facility]     This document was completed utilizing speech recognition software.  Grammatical errors, random word insertions, pronoun errors, and incomplete sentences are an occasional consequence of this system due to software limitations, ambient noise, and hardware issues. Any formal questions or concerns about the content, text or information contained within the body of this dictation should be directly addressed to the provider for clarification.

## 2023-09-23 NOTE — Progress Notes (Signed)
Labs entered for patient

## 2023-09-23 NOTE — Assessment & Plan Note (Addendum)
 Chronic, mild thrombocytosis with platelet counts ranging from 424,000 to 499,000 since October last year.   Differential diagnosis includes iron deficiency, infection, inflammation, medication effects, or bone marrow mutation.   No anemia, infection, inflammation, or splenic surgery. Previous iron supplementation caused mild nausea initially but was otherwise tolerated.  On her consultation with us  on 06/16/2023, labs showed stable platelet count of 470,000.  White count, hemoglobin, MCV were all within normal limits.  CMP grossly unremarkable.  Iron studies showed no evidence of iron deficiency.  ESR, CRP, LDH were all within normal limits. JAK2 V617F mutation analysis, followed by reflex testing to include CALR, MPL, exon 12-15 mutations were all negative.  No evidence of primary myeloproliferative neoplasm.   Platelet count today is better at 447,000.  White blood cell count, RBCs and other parameters are all within normal limits.  Possible medication-induced thrombocytosis due to Adderall  or Trileptal, both with less than 1% chance of causing blood count issues. Condition is borderline and not concerning. - Continue Adderall  and Trileptal as thrombocytosis is not severe. - Encourage hydration, possibly with electrolyte-based drinks like Gatorade. - Monitor platelet count; consider further workup if it rises.  If platelet count is consistently above 500,000, she would benefit from aspirin 81 mg for clot prevention.  RTC in 1 year for follow-up with repeat labs.  She will follow-up with her PCP in the interim.

## 2023-09-28 ENCOUNTER — Ambulatory Visit: Admitting: Physical Therapy

## 2023-09-29 NOTE — Progress Notes (Signed)
 CARDIOLOGY CONSULT NOTE       Patient ID: Gabriela Matthews MRN: [id number] DOB/AGE: 07/17/2000 23 y.o.  Referring Physician: Cristopher Primary Physician: Gabriela Bottcher, NP Primary Cardiologist: Gabriela Matthews Reason for Consultation: Palpitations   HPI:  23 y.o. [facility] for palpitaitons. History of ADHD, Anxiety, Asthma, Depression, OCD. She is on Adderall , and uses xopenex and Trileptal for her asthma. She has had some thrombocytosis but JAK2 testing negative ? Reactionary. PLT count 447. Seen by Gabriela Matthews 09/23/23 in hematology TSH was normal 05/13/23 and Hct 09/23/23 39.4   She just had an ovarian cyst removed. Palpitations since March Don't really bother her  my doctor was more concerned. Indicates max HR only 100-120 She has sinus arrhythmia in room during interview. Denies excess caffeine or drugs. Activity doesn't make it worse. Occur at rest No associated chest pain dyspnea, syncope or high risk family history. She works full time at [employer] She tried to cut her dose of Adderall  back and that did not help. She has been on Adderall  for a long time   ROS All other systems reviewed and negative except as noted above  Past Medical History:  Diagnosis Date   ADHD (attention deficit hyperactivity disorder)    Anxiety    Asthma    Depression    OCD (obsessive compulsive disorder)    ODD (oppositional defiant disorder)    Trichotillomania     Family History  Problem Relation Age of Onset   Thyroid  disease Mother    Diabetes Mother    Hypertension Mother    Hypertension Father     Social History   Socioeconomic History   Marital status: Single    Spouse name: Not on file   Number of children: Not on file   Years of education: Not on file   Highest education level: Not on file  Occupational History   Not on file  Tobacco Use   Smoking status: Never   Smokeless tobacco: Never  Vaping Use   Vaping status: Never Used  Substance and Sexual Activity   Alcohol use: Not  Currently   Drug use: Yes    Types: Marijuana   Sexual activity: Not Currently  Other Topics Concern   Not on file  Social History Narrative   Not on file   Social Drivers of Health   Financial Resource Strain: Not on file  Food Insecurity: No Food Insecurity (06/16/2023)   Hunger Vital Sign    Worried About Running Out of Food in the Last Year: Never true    Ran Out of Food in the Last Year: Never true  Transportation Needs: No Transportation Needs (06/16/2023)   PRAPARE - Administrator, Civil Service (Medical): No    Lack of Transportation (Non-Medical): No  Physical Activity: Not on file  Stress: Not on file  Social Connections: Not on file  Intimate Partner Violence: Not At Risk (06/16/2023)   Humiliation, Afraid, Rape, and Kick questionnaire    Fear of Current or Ex-Partner: No    Emotionally Abused: No    Physically Abused: No    Sexually Abused: No    Past Surgical History:  Procedure Laterality Date   INNER EAR SURGERY     TONSILLECTOMY        Current Outpatient Medications:    amphetamine -dextroamphetamine  (ADDERALL  XR) 20 MG 24 hr capsule, Take 20 mg by mouth every morning., Disp: , Rfl:    cetirizine (ZYRTEC) 10 MG tablet, Take 10 mg by  mouth daily., Disp: , Rfl:    dicyclomine (BENTYL) 20 MG tablet, Take 1 tablet by mouth 4 (four) times daily -  before meals and at bedtime., Disp: , Rfl:    EPINEPHrine  0.3 mg/0.3 mL IJ SOAJ injection, Inject 0.3 mg into the muscle as needed for anaphylaxis., Disp: 2 each, Rfl: 0   GALLIFREY 5 MG tablet, Take 5 mg by mouth daily., Disp: , Rfl:    hydrocortisone 2.5 % cream, Apply 1 Application topically 2 (two) times daily as needed., Disp: , Rfl:    hydrOXYzine (ATARAX) 50 MG tablet, Take 50 mg by mouth 2 (two) times daily as needed., Disp: , Rfl:    ibuprofen  (ADVIL ) 600 MG tablet, Take 1 tablet (600 mg total) by mouth every 6 (six) hours as needed., Disp: 30 tablet, Rfl: 0   methocarbamol (ROBAXIN) 500 MG tablet, Take  500 mg by mouth every 6 (six) hours as needed for muscle spasms., Disp: , Rfl:    ondansetron  (ZOFRAN -ODT) 4 MG disintegrating tablet, Take 1 tablet (4 mg total) by mouth every 8 (eight) hours as needed., Disp: 20 tablet, Rfl: 0   Oxcarbazepine (TRILEPTAL) 300 MG tablet, Take 450-1,050 mg by mouth 2 (two) times daily. 450 mg in the am and 1050 mg at bedtime, Disp: , Rfl: 1   oxcarbazepine (TRILEPTAL) 600 MG tablet, Take 600 mg by mouth 2 (two) times daily. 600mg  in am and 900mg  at HS, Disp: , Rfl:    QVAR REDIHALER 40 MCG/ACT inhaler, Inhale 2 puffs into the lungs 2 (two) times daily., Disp: , Rfl:    XOPENEX HFA 45 MCG/ACT inhaler, Inhale 2 puffs into the lungs every 4 (four) hours as needed for shortness of breath., Disp: , Rfl:     Physical Exam: There were no vitals taken for this visit.  Affect appropriate Healthy:  appears stated age HEENT: normal Neck supple with no adenopathy JVP normal no bruits no thyromegaly Lungs clear with no wheezing and good diaphragmatic motion Heart:  S1/S2 no murmur, no rub, gallop or click PMI normal Abdomen: benighn, BS positve, no tenderness, no AAA no bruit.  No HSM or HJR Distal pulses intact with no bruits No edema Neuro non-focal Skin warm and dry No muscular weakness   Labs:   Lab Results  Component Value Date   WBC 6.2 09/23/2023   HGB 13.2 09/23/2023   HCT 39.4 09/23/2023   MCV 86.4 09/23/2023   PLT 447 (H) 09/23/2023    Recent Labs  Lab 09/23/23 0957  NA 137  K 4.8  CL 105  CO2 27  BUN 15  CREATININE 0.69  CALCIUM 9.0  PROT 7.1  BILITOT 0.2  ALKPHOS 62  ALT 11  AST 12*  GLUCOSE 83      Radiology: No results found.  EKG: SR rate 105 normal no accessory pathway   ASSESSMENT AND PLAN:   Palpitations: benign in patient with ADHD, OCD, anxiety and depression Normal baseline ECG TTE to r/o structural heart dx. Baseline ECG normal  Would avoid beta blocker with asthma Asthma: continue inhalers suggested claritin  during allergy season ADHD:  on adderall  f/u primary can contribute to palpitations Thrombocytosis:  f/u hematology JAK2 testing negative ? Reactive In observation  Echo 30 day monitor  F/U PRN  Signed: Maude Emmer 09/29/2023, 5:31 PM

## 2023-10-04 ENCOUNTER — Ambulatory Visit

## 2023-10-13 ENCOUNTER — Ambulatory Visit: Attending: Internal Medicine | Admitting: Cardiovascular Disease

## 2023-10-13 ENCOUNTER — Ambulatory Visit

## 2023-10-13 ENCOUNTER — Encounter: Payer: Self-pay | Admitting: Cardiovascular Disease

## 2023-10-13 VITALS — BP 130/90 | HR 105 | Ht 65.0 in | Wt 167.0 lb

## 2023-10-13 DIAGNOSIS — D75839 Thrombocytosis, unspecified: Secondary | ICD-10-CM | POA: Diagnosis present

## 2023-10-13 DIAGNOSIS — R002 Palpitations: Secondary | ICD-10-CM

## 2023-10-13 NOTE — Patient Instructions (Addendum)
 Medication Instructions:  Your physician recommends that you continue on your current medications as directed. Please refer to the Current Medication list given to you today.  *If you need a refill on your cardiac medications before your next appointment, please call your pharmacy*  Testing/Procedures: HEART MONITOR Your physician has requested that you wear a Zio heart monitor for 14 days. This will be mailed to your home with instructions on how to apply the monitor and how to return it when finished. Please allow 2 weeks after returning the heart monitor before our office calls you with the results.   ECHO Your physician has requested that you have an echocardiogram. Echocardiography is a painless test that uses sound waves to create images of your heart. It provides your doctor with information about the size and shape of your heart and how well your heart's chambers and valves are working. This procedure takes approximately one hour. There are no restrictions for this procedure. Please do NOT wear cologne, perfume, aftershave, or lotions (deodorant is allowed). Please arrive 15 minutes prior to your appointment time.  Please note: We ask at that you not bring children with you during ultrasound (echo/ vascular) testing. Due to room size and safety concerns, children are not allowed in the ultrasound rooms during exams. Our front office staff cannot provide observation of children in our lobby area while testing is being conducted. An adult accompanying a patient to their appointment will only be allowed in the ultrasound room at the discretion of the ultrasound technician under special circumstances. We apologize for any inconvenience.  Follow-Up: At [facility], you and your health needs are our priority.  As part of our continuing mission to provide you with exceptional heart care, our providers are all part of one team.  This team includes your primary Cardiologist (physician) and  Advanced Practice Providers or APPs (Physician Assistants and Nurse Practitioners) who all work together to provide you with the care you need, when you need it.  Your next appointment:   As needed  Provider:   Maude Emmer, MD  We recommend signing up for the patient portal called MyChart.  Sign up information is provided on this After Visit Summary.  MyChart is used to connect with patients for Virtual Visits (Telemedicine).  Patients are able to view lab/test results, encounter notes, upcoming appointments, etc.  Non-urgent messages can be sent to your provider as well.    To learn more about what you can do with MyChart, go to [email or web address].   Other Instructions ZIO XT- Long Term Monitor Instructions  Your physician has requested you wear a ZIO patch monitor for 14 days.   This is a single patch monitor. [employer] supplies one patch monitor per enrollment. Additional  stickers are not available. Please do not apply patch if you will be having a Nuclear Stress Test,  Echocardiogram, Cardiac CT, MRI, or Chest Xray during the period you would be wearing the  monitor. The patch cannot be worn during these tests. You cannot remove and re-apply the  ZIO XT patch monitor.   Your ZIO patch monitor will be mailed 3 day USPS to your address on file. It may take 3-5 days  to receive your monitor after you have been enrolled.   Once you have received your monitor, please review the enclosed instructions. Your monitor  has already been registered assigning a specific monitor serial # to you.     Billing and Patient Assistance Program Information  We have  supplied [employer] with any of your insurance information on file for billing purposes.  [employer] offers a sliding scale Patient Assistance Program for patients that do not have  insurance, or whose insurance does not completely cover the cost of the ZIO monitor.  You must apply for the Patient Assistance Program to qualify for this  discounted rate.   To apply, please call [employer] at [phone], select option 4, select option 2, ask to apply for  Patient Assistance Program. Meredeth will ask your household income, and how many people  are in your household. They will quote your out-of-pocket cost based on that information.  [employer] will also be able to set up a 39-month, interest-free payment plan if needed.     Applying the monitor  Shave hair from upper left chest.  Hold abrader disc by orange tab. Rub abrader in 40 strokes over the upper left chest as  indicated in your monitor instructions.  Clean area with 4 enclosed alcohol pads. Let dry.  Apply patch as indicated in monitor instructions. Patch will be placed under collarbone on left  side of chest with arrow pointing upward.  Rub patch adhesive wings for 2 minutes. Remove white label marked 1. Remove the white  label marked 2. Rub patch adhesive wings for 2 additional minutes.  While looking in a mirror, press and release button in center of patch. A small green light will  flash 3-4 times. This will be your only indicator that the monitor has been turned on.  Do not shower for the first 24 hours. You may shower after the first 24 hours.  Press the button if you feel a symptom. You will hear a small click. Record Date, Time and  Symptom in the Patient Logbook.  When you are ready to remove the patch, follow instructions on the last 2 pages of Patient  Logbook. Stick patch monitor onto the last page of Patient Logbook.   Place Patient Logbook in the blue and white box. Use locking tab on box and tape box closed  securely. The blue and white box has prepaid postage on it. Please place it in the mailbox as  soon as possible. Your physician should have your test results approximately 7 days after the  monitor has been mailed back to [facility].   Call [facility] Customer Care at [phone] if you have questions regarding  your ZIO XT patch  monitor. Call them immediately if you see an orange light blinking on your  monitor.   If your monitor falls off in less than 4 days, contact our Monitor department at [phone].   If your monitor becomes loose or falls off after 4 days call [employer] at [phone] for  suggestions on securing your monitor.

## 2023-10-13 NOTE — Progress Notes (Unsigned)
 Enrolled patient for a 14 day Zio XT  monitor to be mailed to patients home

## 2023-11-22 ENCOUNTER — Ambulatory Visit (HOSPITAL_COMMUNITY)
Admission: RE | Admit: 2023-11-22 | Discharge: 2023-11-22 | Disposition: A | Discharge status: Home / Self Care | Source: Ambulatory Visit | Attending: Cardiovascular Disease | Admitting: Cardiovascular Disease

## 2023-11-22 DIAGNOSIS — R002 Palpitations: Secondary | ICD-10-CM | POA: Insufficient documentation

## 2023-11-22 LAB — ECHOCARDIOGRAM COMPLETE
Area-P 1/2: 5.01 cm2
S' Lateral: 2.5 cm

## 2023-11-23 ENCOUNTER — Ambulatory Visit: Payer: Self-pay | Admitting: Cardiovascular Disease

## 2023-11-26 DIAGNOSIS — R002 Palpitations: Secondary | ICD-10-CM

## 2024-01-16 ENCOUNTER — Emergency Department (HOSPITAL_BASED_OUTPATIENT_CLINIC_OR_DEPARTMENT_OTHER)
Admission: EM | Admit: 2024-01-16 | Discharge: 2024-01-16 | Disposition: A | Discharge status: Home / Self Care | Attending: Emergency Medicine | Admitting: Emergency Medicine

## 2024-01-16 ENCOUNTER — Encounter (HOSPITAL_BASED_OUTPATIENT_CLINIC_OR_DEPARTMENT_OTHER): Payer: Self-pay

## 2024-01-16 ENCOUNTER — Emergency Department (HOSPITAL_BASED_OUTPATIENT_CLINIC_OR_DEPARTMENT_OTHER)

## 2024-01-16 ENCOUNTER — Emergency Department (HOSPITAL_BASED_OUTPATIENT_CLINIC_OR_DEPARTMENT_OTHER): Admitting: Radiology

## 2024-01-16 ENCOUNTER — Other Ambulatory Visit: Payer: Self-pay

## 2024-01-16 DIAGNOSIS — N390 Urinary tract infection, site not specified: Secondary | ICD-10-CM | POA: Diagnosis not present

## 2024-01-16 DIAGNOSIS — R300 Dysuria: Secondary | ICD-10-CM | POA: Diagnosis present

## 2024-01-16 LAB — CBC WITH DIFFERENTIAL/PLATELET
Abs Immature Granulocytes: 0.01 K/uL (ref 0.00–0.07)
Basophils Absolute: 0 K/uL (ref 0.0–0.1)
Basophils Relative: 0 %
Eosinophils Absolute: 0.1 K/uL (ref 0.0–0.5)
Eosinophils Relative: 1 %
HCT: 32.8 % — ABNORMAL LOW (ref 36.0–46.0)
Hemoglobin: 10.5 g/dL — ABNORMAL LOW (ref 12.0–15.0)
Immature Granulocytes: 0 %
Lymphocytes Relative: 30 %
Lymphs Abs: 2.5 K/uL (ref 0.7–4.0)
MCH: 29.6 pg (ref 26.0–34.0)
MCHC: 32 g/dL (ref 30.0–36.0)
MCV: 92.4 fL (ref 80.0–100.0)
Monocytes Absolute: 0.7 K/uL (ref 0.1–1.0)
Monocytes Relative: 8 %
Neutro Abs: 5 K/uL (ref 1.7–7.7)
Neutrophils Relative %: 61 %
Platelets: 427 K/uL — ABNORMAL HIGH (ref 150–400)
RBC: 3.55 MIL/uL — ABNORMAL LOW (ref 3.87–5.11)
RDW: 12.6 % (ref 11.5–15.5)
WBC: 8.3 K/uL (ref 4.0–10.5)
nRBC: 0 % (ref 0.0–0.2)

## 2024-01-16 LAB — URINALYSIS, W/ REFLEX TO CULTURE (INFECTION SUSPECTED)
Bilirubin Urine: NEGATIVE
Glucose, UA: 250 mg/dL — AB
Hgb urine dipstick: NEGATIVE
Ketones, ur: NEGATIVE mg/dL
Nitrite: POSITIVE — AB
Protein, ur: 100 mg/dL — AB
Specific Gravity, Urine: 1.025 (ref 1.005–1.030)
pH: 5 (ref 5.0–8.0)

## 2024-01-16 LAB — COMPREHENSIVE METABOLIC PANEL WITH GFR
ALT: 20 U/L (ref 0–44)
AST: 18 U/L (ref 15–41)
Albumin: 4.6 g/dL (ref 3.5–5.0)
Alkaline Phosphatase: 82 U/L (ref 38–126)
Anion gap: 13 (ref 5–15)
BUN: 20 mg/dL (ref 6–20)
CO2: 23 mmol/L (ref 22–32)
Calcium: 9.1 mg/dL (ref 8.9–10.3)
Chloride: 102 mmol/L (ref 98–111)
Creatinine, Ser: 0.75 mg/dL (ref 0.44–1.00)
GFR, Estimated: >60 mL/min
Glucose, Bld: 79 mg/dL (ref 70–99)
Potassium: 3.9 mmol/L (ref 3.5–5.1)
Sodium: 138 mmol/L (ref 135–145)
Total Bilirubin: 0.4 mg/dL (ref 0.0–1.2)
Total Protein: 7 g/dL (ref 6.5–8.1)

## 2024-01-16 LAB — PREGNANCY, URINE: Preg Test, Ur: NEGATIVE

## 2024-01-16 MED ORDER — IOHEXOL 350 MG/ML SOLN
80.0000 mL | Freq: Once | INTRAVENOUS | Status: AC | PRN
  Administered 2024-01-16: 80 mL via INTRAVENOUS

## 2024-01-16 MED ORDER — CEFUROXIME AXETIL 500 MG PO TABS: 500.0000 mg | ORAL_TABLET | Freq: Two times a day (BID) | ORAL | 0 refills | Status: AC

## 2024-01-16 MED ORDER — PROMETHAZINE HCL 25 MG PO TABS: 25.0000 mg | ORAL_TABLET | Freq: Four times a day (QID) | ORAL | 0 refills | Status: AC | PRN

## 2024-01-16 NOTE — Discharge Instructions (Addendum)
 Start cefuroxime , 1 tablet by mouth twice daily for 7 days.  Start Phenergan  and take 1 tablet by mouth every 6 hours as needed for nausea/vomiting.  Contact your primary care provider to check on the status of your urology referral, please follow-up with your primary care provider in regard to your ongoing symptoms.  Return to the emergency department if your symptoms worsen or if you develop a fever.

## 2024-01-16 NOTE — ED Triage Notes (Signed)
 Arrives POV with complaints of worsening back pain related to an ongoing UTI she's had for two months. Patient has been treated with 3 courses of antibiotics and OTC meds with minimal relief. She's reporting increased pain and fatigue.

## 2024-01-16 NOTE — ED Provider Notes (Signed)
 " [place] EMERGENCY DEPARTMENT AT [facility] Provider Note   CSN: [id number] Arrival date & time: 01/16/24  1705     Patient presents with: Back Pain, Urinary Tract Infection, and Weakness   Gabriela Matthews is a 24 y.o. female.   24 year old female presenting with dysuria, back pain, generalized fatigue.  Patient notes ongoing urinary symptoms since early November, has been on 3 or 4 rounds of antibiotics since that time, most recently she was seen urgent care yesterday and treated with doxycycline, has also recently been on course of Macrobid/Keflex/Cipro.  Notes minimal/if any alleviation of her symptoms despite compliance with antibiotics therapy, has tried Azo as well without much relief.  Denies fever at home.  Denies cough, shortness of breath.  Denies vaginal symptoms.   Back Pain Associated symptoms: weakness   Urinary Tract Infection Weakness      Prior to Admission medications  Medication Sig Start Date End Date Taking? Authorizing Provider  amphetamine -dextroamphetamine  (ADDERALL  XR) 20 MG 24 hr capsule Take 20 mg by mouth every morning. 06/09/23   [provider]  cetirizine (ZYRTEC) 10 MG tablet Take 10 mg by mouth daily. 08/09/23   [provider]  dicyclomine (BENTYL) 20 MG tablet Take 1 tablet by mouth 4 (four) times daily -  before meals and at bedtime. 06/22/23   [provider]  EPINEPHrine  0.3 mg/0.3 mL IJ SOAJ injection Inject 0.3 mg into the muscle as needed for anaphylaxis. 09/15/22   Victor Agent T, PA-C  GALLIFREY 5 MG tablet Take 5 mg by mouth daily.    [provider]  hydrocortisone 2.5 % cream Apply 1 Application topically 2 (two) times daily as needed. 02/05/23   [provider]  hydrOXYzine (ATARAX) 50 MG tablet Take 50 mg by mouth 2 (two) times daily as needed. 09/07/23   [provider]  ibuprofen  (ADVIL ) 600 MG tablet Take 1 tablet (600 mg total) by mouth every 6 (six) hours as needed.  04/02/23   Odell Balls, PA-C  methocarbamol (ROBAXIN) 500 MG tablet Take 500 mg by mouth every 6 (six) hours as needed for muscle spasms. 07/05/23   [provider]  ondansetron  (ZOFRAN -ODT) 4 MG disintegrating tablet Take 1 tablet (4 mg total) by mouth every 8 (eight) hours as needed. 03/19/23   Silver Wonda LABOR, PA  Oxcarbazepine (TRILEPTAL) 300 MG tablet Take 450-1,050 mg by mouth 2 (two) times daily. 450 mg in the am and 1050 mg at bedtime 11/04/17   [provider]  oxcarbazepine (TRILEPTAL) 600 MG tablet Take 600 mg by mouth 2 (two) times daily. 600mg  in am and 900mg  at [facility] Patient not taking: Reported on 10/13/2023    [provider]  QVAR REDIHALER 40 MCG/ACT inhaler Inhale 2 puffs into the lungs 2 (two) times daily. Patient not taking: Reported on 10/13/2023 08/09/23   [provider]  XOPENEX HFA 45 MCG/ACT inhaler Inhale 2 puffs into the lungs every 4 (four) hours as needed for shortness of breath. Patient not taking: Reported on 10/13/2023 08/09/23   [provider]    Allergies: Gabapentin  and Other    Review of Systems  Musculoskeletal:  Positive for back pain.  Neurological:  Positive for weakness.    Updated Vital Signs  Vitals:   01/16/24 1711 01/16/24 1713 01/16/24 1713  BP: 132/87    Pulse: (!) 102    Resp: 17    Temp: 98.1 F (36.7 C)    TempSrc: Oral    SpO2: 99%  99%   Weight:   73.9 kg  Height:   5' 5 (1.651 m)     Physical Exam Vitals and nursing note reviewed.  Constitutional:      General: She is not in acute distress.    Appearance: Normal appearance. She is not ill-appearing or toxic-appearing.  HENT:     Head: Normocephalic.  Eyes:     Extraocular Movements: Extraocular movements intact.  Cardiovascular:     Rate and Rhythm: Normal rate and regular rhythm.  Pulmonary:     Effort: Pulmonary effort is normal.     Breath sounds: Normal breath sounds.  Abdominal:     Palpations: Abdomen is soft.      Tenderness: There is no abdominal tenderness. There is no right CVA tenderness, left CVA tenderness or guarding.  Musculoskeletal:     Cervical back: Normal range of motion.     Comments: Back: No midline spinous process tenderness to palpation/percussion.  Vague tenderness noted to bilateral paralumbar muscle region.  Skin:    General: Skin is warm and dry.  Neurological:     Mental Status: She is alert and oriented to person, place, and time.     (all labs ordered are listed, but only abnormal results are displayed) Labs Reviewed  CBC WITH DIFFERENTIAL/PLATELET - Abnormal; Notable for the following components:      Result Value   RBC 3.55 (*)    Hemoglobin 10.5 (*)    HCT 32.8 (*)    Platelets 427 (*)    All other components within normal limits  URINALYSIS, W/ REFLEX TO CULTURE (INFECTION SUSPECTED) - Abnormal; Notable for the following components:   Color, Urine ORANGE (*)    Glucose, UA 250 (*)    Protein, ur 100 (*)    Nitrite POSITIVE (*)    Leukocytes,Ua TRACE (*)    Bacteria, UA RARE (*)    All other components within normal limits  URINE CULTURE  COMPREHENSIVE METABOLIC PANEL WITH GFR  PREGNANCY, URINE    EKG: None  Radiology: CT ABDOMEN PELVIS W CONTRAST Result Date: 01/16/2024 EXAM: CT ABDOMEN AND PELVIS WITH CONTRAST 01/16/2024 07:12:25 PM TECHNIQUE: CT of the abdomen and pelvis was performed with the administration of 80 mL of iohexol  (OMNIPAQUE ) 350 MG/ML injection. Multiplanar reformatted images are provided for review. Automated exposure control, iterative reconstruction, and/or weight-based adjustment of the mA/kV was utilized to reduce the radiation dose to as low as reasonably achievable. COMPARISON: Ultrasound pelvis 04/02/2023 and CT abdomen and pelvis 02/11/2023. CLINICAL HISTORY: Abdominal pain, acute, nonlocalized. Worsening back pain related to ongoing urinary tract infection, persisting despite antibiotics. FINDINGS: LOWER CHEST: Lung bases are clear.  No acute abnormality. LIVER: The liver is unremarkable and normal. GALLBLADDER AND BILE DUCTS: Gallbladder is unremarkable and normal. No biliary ductal dilatation. SPLEEN: No acute abnormality and normal. PANCREAS: No acute abnormality and normal. ADRENAL GLANDS: No acute abnormality and normal. KIDNEYS, URETERS AND BLADDER: The kidneys, ureters, and bladder are normal. No stones in the kidneys or ureters. No hydronephrosis. No perinephric or periureteral stranding. GI AND BOWEL: Small soft hiatal hernia. Stomach, small bowel, and colon are not abnormally distended. No wall thickening or inflammatory stranding are identified. Stool throughout the colon. The appendix is normal. There is no bowel obstruction. PERITONEUM AND RETROPERITONEUM: No ascites. No free air. VASCULATURE: Aorta is normal in caliber and normal. LYMPH NODES: No lymphadenopathy. Retroperitoneal lymph nodes are normal. REPRODUCTIVE ORGANS: No acute abnormality. The uterus and ovaries are not enlarged. BONES AND SOFT  TISSUES: No acute osseous abnormality. No focal soft tissue abnormality. IMPRESSION: 1. No acute findings in the abdomen or pelvis. Electronically signed by: Elsie Gravely MD 01/16/2024 07:18 PM EST RP Workstation: [id number]   DG Chest 2 View Result Date: 01/16/2024 CLINICAL DATA:  Back pain EXAM: CHEST - 2 VIEW COMPARISON:  November 04, 2004 FINDINGS: The heart size and mediastinal contours are within normal limits. Both lungs are clear. The visualized skeletal structures are unremarkable. IMPRESSION: No active cardiopulmonary disease. Electronically Signed   By: Lynwood Landy Raddle M.D.   On: 01/16/2024 17:40     Procedures   Medications Ordered in the ED  iohexol  (OMNIPAQUE ) 350 MG/ML injection 80 mL (80 mLs Intravenous Contrast Given 01/16/24 1911)                                    Medical Decision Making This patient presents to the ED for concern of urinary tract infection, this involves an extensive number of  treatment options, and is a complaint that carries with it a high risk of complications and morbidity.  The differential diagnosis includes UTI, vulvovaginitis, vaginitis, pyelonephritis, nephrolithiasis/ureterolithiasis   Co morbidities that complicate the patient evaluation  Recurrent urinary tract infections   Additional history obtained:  Additional history obtained from record review External records from outside source obtained and reviewed including recent note from [place] walk-in clinic   Lab Tests:  I Ordered, and personally interpreted labs.  The pertinent results include: CBC without leukocytosis, hemoglobin of 10.5 is down from most recent baseline of 13.2, thrombocytosis noted but is largely stable from previous.  CMP within normal limits.  Urine pregnancy test negative.  Urinalysis notable for positive nitrites/leukocytes with bacteria, will send for culture but most consistent with urinary tract infection.    Imaging Studies ordered:  I ordered imaging studies including CXR, CT abdomen/pelvis  I independently visualized and interpreted imaging which showed  - CXR: No active cardiopulmonary disease. - CT abdomen/pelvis: 1. No acute findings in the abdomen or pelvis. I agree with the radiologist interpretation   Problem List / ED Course / Critical interventions / Medication management I have reviewed the patients home medicines and have made adjustments as needed   Test / Admission - Considered:  Physical exam unremarkable as above, no abdominal tenderness/guarding, no CVA tenderness, complains of vague paralumbar muscle discomfort bilaterally, no midline spinous process tenderness palpation.  CT imaging unremarkable as above, no evidence of pyelonephritis.  No fever or leukocytosis. Patient complains of ongoing urinary symptoms including dysuria and urinary frequency, was seen at [facility] walk-in clinic yesterday or the day prior and was prescribed doxycycline, unclear  why doxycycline was prescribed as this is not typically used for the treatment of urinary tract infections.  Patient has recently been on Macrobid/Keflex/Cipro since November, will switch to a different cephalosporin as she was found to have a urinary tract infection today.  Initially I did recommend continuation of antibiotic that she was started on yesterday, however patient and her parent are insistent that a new medication be started, as this has not proven effective yet.  I reiterated with the patient and her parent that it is unlikely that her symptoms will resolve entirely within 24 hours, but again they are requesting a separate antibiotic.  Denies vaginal symptoms, patient did have a pelvic exam in December with cultures/swabs obtained at that time that were negative per chart review.  Patient  also complains of nausea, has not had relief of her symptoms with use of Zofran , will prescribe a separate antiemetic.  Patient has been referred to urology but has not had an appointment yet, I recommend that she follow-up with her PCP in regard to her ongoing symptoms.  Return precautions discussed, she is appropriate for discharge at this time.     Amount and/or Complexity of Data Reviewed Labs: ordered. Radiology: ordered.  Risk Prescription drug management.        Final diagnoses:  Urinary tract infection without hematuria, site unspecified    ED Discharge Orders          Ordered    cefUROXime  (CEFTIN ) 500 MG tablet  2 times daily with meals        01/16/24 2013    promethazine  (PHENERGAN ) 25 MG tablet  Every 6 hours PRN        01/16/24 2013               Glendia Rocky SAILOR, PA-C 01/16/24 2019    Ruthe Cornet, DO 01/16/24 2224  "

## 2024-01-17 LAB — PATHOLOGIST SMEAR REVIEW

## 2024-01-18 LAB — URINE CULTURE: Culture: NO GROWTH
